# Patient Record
Sex: Male | Born: 1955 | Race: White | Hispanic: No | Marital: Married | Smoking: Former smoker
Health system: Southern US, Community
[De-identification: ages and names within clinical notes are randomized; demographics above are authoritative.]

## PROBLEM LIST (undated history)

## (undated) DIAGNOSIS — M549 Dorsalgia, unspecified: Secondary | ICD-10-CM

## (undated) DIAGNOSIS — M1990 Unspecified osteoarthritis, unspecified site: Secondary | ICD-10-CM

## (undated) DIAGNOSIS — I639 Cerebral infarction, unspecified: Secondary | ICD-10-CM

## (undated) DIAGNOSIS — M4802 Spinal stenosis, cervical region: Secondary | ICD-10-CM

## (undated) HISTORY — PX: OTHER SURGICAL HISTORY: SHX169

---

## 2007-04-08 DIAGNOSIS — I639 Cerebral infarction, unspecified: Secondary | ICD-10-CM

## 2007-04-08 HISTORY — DX: Cerebral infarction, unspecified: I63.9

## 2007-08-18 ENCOUNTER — Other Ambulatory Visit: Payer: Self-pay

## 2007-08-18 ENCOUNTER — Emergency Department: Payer: Self-pay | Admitting: Internal Medicine

## 2007-09-14 ENCOUNTER — Ambulatory Visit: Payer: Self-pay | Admitting: Cardiology

## 2007-09-14 ENCOUNTER — Observation Stay (HOSPITAL_COMMUNITY): Admission: EM | Admit: 2007-09-14 | Discharge: 2007-09-15 | Payer: Self-pay | Admitting: Emergency Medicine

## 2007-09-15 ENCOUNTER — Encounter (INDEPENDENT_AMBULATORY_CARE_PROVIDER_SITE_OTHER): Payer: Self-pay | Admitting: Neurology

## 2007-09-22 ENCOUNTER — Inpatient Hospital Stay (HOSPITAL_COMMUNITY): Admission: EM | Admit: 2007-09-22 | Discharge: 2007-09-24 | Payer: Self-pay | Admitting: Emergency Medicine

## 2007-09-22 ENCOUNTER — Ambulatory Visit: Payer: Self-pay | Admitting: Cardiology

## 2007-09-29 ENCOUNTER — Ambulatory Visit: Payer: Self-pay

## 2007-10-20 ENCOUNTER — Ambulatory Visit: Payer: Self-pay | Admitting: Cardiology

## 2007-11-05 ENCOUNTER — Inpatient Hospital Stay (HOSPITAL_COMMUNITY): Admission: RE | Admit: 2007-11-05 | Discharge: 2007-11-07 | Payer: Self-pay | Admitting: Neurosurgery

## 2010-08-20 NOTE — H&P (Signed)
NAME:  Joseph Butler, Joseph Butler NO.:  [id number]   MEDICAL RECORD NO.:  [id number]          PATIENT TYPE:  INP   LOCATION:  3035                         FACILITY:  [facility]   PHYSICIAN:  Levert Feinstein, MD          DATE OF BIRTH:  [birth year]   DATE OF ADMISSION:  09/14/2007  DATE OF DISCHARGE:                              HISTORY & PHYSICAL   CHIEF COMPLAINT:  Acute onset of left side numbness.   HISTORY OF PRESENT ILLNESS:  Joseph Butler is a 55 year old right-handed  male, presenting with acute onset of left side numbness, starting at the  left face, spreading to involving the left chest, upper extremity, flank  area and also left lower extremity around 7:30 this morning.  During the  intense left hemiparesthesia he also described difficulty with his left  leg, but he was able to ambulate to his wife's office but described he  had to drag his left leg.  Symptom has since improved and he has similar  presentation on Aug 17, 2007, has been evaluated by Dr. Sandria Manly recently as  an outpatient, MRI of the brain was pending.  He was started on aspirin  and Plavix, since the visit with Dr. Sandria Manly about a week ago he is on  Plavix only.   REVIEW OF SYSTEMS:  He described left chest tightness sensation as if  muscle is pulled, deny GI, GU, and endocrine or pulmonary complaints.   PAST MEDICAL HISTORY:  Heavy smoker for over 30 years.   PAST SURGICAL HISTORY:  None.   FAMILY HISTORY:  Mother suffered coronary artery disease.  Father  suffered epilepsy.   SOCIAL HISTORY:  Heavy smoker.  Denied drink or illicit drugs.   PHYSICAL EXAMINATION:  VITAL SIGNS:  Temperature 98, blood pressure 140-  158/75-91, heart rate of 89, and respirations 12.  CARDIAC:  Regular rate and rhythm.  PULMONARY:  Clear to auscultation bilaterally.  NECK:  Supple.  No carotid bruits.  NEUROLOGIC:  He is alert and oriented to history taking, conversation.  There was no dysarthria, no aphagia.  Cranial nerve 2  through 12 are  normal.  MOTOR EXAMINATION:  He had demonstrated variable effort on left lower  extremity motor examination, but the motor strength was felt to be  normal in 4 limbs.  SENSORY EXAMINATION:  He described decreased light touch and pinprick on  the left side and there was no extinction but he split the midline with  tuning fork exam.  Deep tendon reflexes were brisk and symmetric.  Plantar responses were flexor.  He has no dysmetria.  Gait was deferred.  NIH stroke scale 1.   LAB EVALUATION:  CBC was normal.  CT of the brain without contrast was  reviewed that was with no acute findings.   ASSESSMENT AND PLAN:  A 55 year old heavy smoker presenting with left  acute hemiparesis, variable effort on examination.  Differentiation  diagnosis including transient ischemic attack, involving the right  thalamus versus angina.  Plan is as following,  1. Telemetry.  Admit to stroke service.  2.  MRI of the brain.  3. Complete stroke workup including echocardiogram lab evaluation.  4. Plavix 75 mg every day  5. I also discussed with the family with rapidly improving symptoms      and sensory only symptoms, he is not a candidate for IV TPA.  The      patient and family agrees.   ADDENDUM:  MRI of brain: no acute lesion. [place] of carotid artery: patent.  LDL 130, starting zocor 20mg  qday, keep plavix.  ECHO report pending.  NO acute cerebral vascular event or MI found.      Levert Feinstein, MD  Electronically Signed     YY/MEDQ  D:  09/14/2007  T:  09/15/2007  Job:  [id number]

## 2010-08-20 NOTE — Discharge Summary (Signed)
NAME:  Joseph Butler, Joseph Butler NO.:  [id number]   MEDICAL RECORD NO.:  [id number]          PATIENT TYPE:  INP   LOCATION:  3001                         FACILITY:  [facility]   PHYSICIAN:  Pramod P. Pearlean Brownie, MD    DATE OF BIRTH:  February 13, 1956   DATE OF ADMISSION:  09/22/2007  DATE OF DISCHARGE:  09/24/2007                               DISCHARGE SUMMARY   DISCHARGE DIAGNOSES:  1. Left arm and left face numbness, not felt to be a transient ishemic      attack, no stroke, question related to cardiology etiology for cord      compression.  2. Transient ischemic attack on September 14, 2007.  3. Tobacco abuse.  4. Hyperlipidemia.  5. Polycythemia vera.  6. 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  7. Chronic obstructive pulmonary disease.  8. Rotator cuff problems.   DISCHARGE MEDICATIONS:  1. Plavix 75 mg a day.  2. Zocor 20 mg a day.  3. Aspirin 81 mg a day.   STUDIES PERFORMED:  1. MRI of the brain is normal.  2. MRA of the brain shows no significant stenosis or pathologic      findings.  3. MRA of the neck shows congenital variations, but no pathologic      findings.  4. 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  Moderate to severe predominantly left-sided central      canal stenosis at C3-C4.  Severe left-sided foraminal narrowing at      C4-C5 and C6-C7 and greater extent at C3-C4 and C5-C6.  Severe      right-sided foraminal stenosis at C4-C5 and also at C5-C6 and      moderate right-sided foraminal disease at C3-C4 and C6-C7.  Mild-to-      moderate facet hypertrophy at C6-C7 without significant stenosis.  5. EKG shows normal sinus rhythm.   LABORATORY STUDIES:  CBC, normal.  Chemistry with glucose 138, normal.  Coags normal.  Liver function tests normal.  Cardiac enzymes negative.   HISTORY OF PRESENT ILLNESS:  Mr. Heston Widener is a 55 year old right-  handed Caucasian male who just left the hospital 1 week ago with similar  symptoms.  The patient says he awoke the morning of the admission  feeling okay, then had episode where he felt pain in his neck and then  fixation over his head.  His numbness extended into the left side of his  face and arm and to a lesser extent of the leg.  Associated with this,  he has sensation of chest pressure and mild shortness of breath.  He  took an aspirin, felt a little bit better, then tried to go to work.  Once he got active at work, he began to feel poorly again.  He called  his wife in the office and therefore was advised to come to the  emergency department for further evaluation.  In the emergency room, he  is still experiencing a bit of numbness on the left side.  He was  admitted  just 1 week ago with similar symptoms where he had a full  workup including MRA.  Everything was found to be unremarkable other  than some elevated lipids for which he was started on Zocor.  The  patient stated he had a similar episode about 1 month ago for which he  was seen at [facility] and started on aspirin and Plavix.  He was  admitted to the hospital today for further stroke evaluation.  He was  not a TPA candidate secondary to quick resolution of symptoms.   HOSPITAL COURSE:  Again, MRI was negative for acute stroke.  MRA showed  no significant stenosis.  MRI of the C-spine did show some cord  compression from C2 down to C6 a variant degree, which likely could be  the etiology of his symptoms.  The patient also had a cardiology consult  in the hospital and outpatient Myoview is scheduled with Dr. Regino Schultze PA  on October 20, 2007, at 1:15 p.m.   DISCHARGE CONDITION:  The patient is awake, alert, and oriented.  Speech  clear.  No aphasia.  He still has an subjective numbness in the left  side of his face and he has decreased sensation on the left side and  splits the midline on his forehead and sternum.  He has no extremity  weakness.  His tendon reflexes are 2+ bilaterally.   Plantars are down  bilaterally.   DISCHARGE/PLAN:  1. Referred to Dr. Danae Orleans. Venetia Maxon at the patient's request.  I have      called their office and left a message.  I also asked for      neurologic staff to help with this referral, however, this was only      a recorded line.  2. Follow up Dr. Regino Schultze PA on October 20, 2007, at 1:15 p.m.  3. Exercise stress test on September 29, 2007, at 9:45 a.m.  4. Follow up with primary care and Dr. Sandria Manly as needed.      Annie Main, N.P.    ______________________________  Sunny Schlein. Pearlean Brownie, MD    SB/MEDQ  D:  09/24/2007  T:  09/24/2007  Job:  [id number]   cc:   Everardo Beals. Juanda Chance, MD, [facility]  Danae Orleans. Venetia Maxon, M.D.  Genene Churn. Love, M.D.

## 2010-08-20 NOTE — Op Note (Signed)
NAME:  Joseph Butler, Joseph Butler NO.:  [id number]   MEDICAL RECORD NO.:  [id number]          PATIENT TYPE:  INP   LOCATION:  3113                         FACILITY:  [facility]   PHYSICIAN:  Danae Orleans. Venetia Maxon, M.D.  DATE OF BIRTH:  06-15-1955   DATE OF PROCEDURE:  11/05/2007  DATE OF DISCHARGE:                               OPERATIVE REPORT   PREOPERATIVE DIAGNOSES:  Herniated cervical disk with cervical  myelopathy, cervical stenosis, and spondylosis with myelopathy with  cervical radiculopathy C3-C4, C4-C5, and C5-C6 levels.   POSTOPERATIVE DIAGNOSES:  Herniated cervical disk with cervical  myelopathy, cervical stenosis, and spondylosis with myelopathy with  cervical radiculopathy C3-C4, C4-C5, and C5-C6 levels.   PROCEDURE:  Anterior cervical decompression and fusion C3-C4, C4-C5, and  C5-C6 levels with allograft, bone graft, morselized autograft, 4 troughs  anterior cervical plating C3 through C6 levels.   SURGEON:  Danae Orleans. Venetia Maxon, MD   ASSISTANT:  Georgiann Cocker, RN and Hewitt Shorts, MD   ANESTHESIA:  General endotracheal anesthesia.   ESTIMATED BLOOD LOSS:  Minimal.   COMPLICATIONS:  None.   DISPOSITION:  Recovery.   INDICATIONS:  Joseph Butler is a 55 year old man with multilevel herniated  cervical disk with cervical spondylosis and stenosis with cervical  myelopathy.  It was elected to take him to surgery for anterior cervical  decompression and fusion at the C3-C4, C4-C5, and C5-C6 levels.  He has  a spinal canal diameter approximately 6 mm at C5-C6 and 5.9 mm at C4-C5,  worse on the left.   PROCEDURE:  Joseph Butler was brought to the operating room.  Following  satisfactory and uncomplicated induction of general endotracheal  anesthesia and placing intravenous lines, the patient was placed in a  supine position on the operating table.  His neck was placed in neutral  alignment.  He was placed in 5 pounds Holter traction.  The anterior  neck was then prepped  and draped in usual sterile fashion.  Area of  planned incision was infiltrated with 0.25% Marcaine and 0.5% lidocaine  with 1:200,000 epinephrine.  Incision was made from midline to the  anterior border sternocleidomastoid muscle on the left side of midline  in middle neck crease, carried sharply through platysmal layer.  Subplatysmal dissection was performed exposing the anterior border  sternocleidomastoid muscle using blunt dissection and handheld  retractors.  The carotid sheath was kept lateral and trachea and  esophagus kept medial exposing the anterior cervical spine.  Bent spinal  needle was placed and he was felt to be at the C3-C4 level and this was  confirmed on intraoperative x-ray.  Subsequently, the longus colli  muscles were taken down from the anterior cervical spine from C3-C6  using electrocautery and Key elevator and self-retaining shadow line  retractor was placed to facilitate exposure along up and down  retractors.  The interspaces at C4-C5 and C5-C6 were then incised with  15 blade and disk material was removed in piecemeal fashion.  Endplates  were stripped with residual disk material.  Additionally, this was done  at the C3-C4 level.  Using  intraoperative microscope, the endplates were  decorticated with high-speed drill initially at C5-C6 and the uncinate  spurs were drilled down.  The spinal cord dura was decompressed as were  both neural foramina.  Hemostasis was assured with Gelfoam soaked in  thrombin.  After trial sizing a 6-mm allograft bone wedge was selected,  fashioned with high-speed drill pack with morselized autograft and also  4 troughs.  This was inserted in the interspace and countersunk  appropriately.  Similar decompression was performed at C4-C5 level.  Again, spinal cord dura was decompressed as were both neural foramina.  Care was taken not to __________ instrument the neural foramina out of  concern for the fragility of the C5 nerve roots.   It was felt that the  spinal cord dura was adequately decompressed.  Hemostasis was assured  with Gelfoam soaked in thrombin.  Subsequently, a 6-mm bone allograft  which was selected to fashion with high-speed drill pack, morselized  bone autograft, and 4 troughs inserted in the interspace and countersunk  appropriately.  Attention was then turned to the C3-C4 level, similar  decompression was performed and at this level a 7-mm bone graft was  fashioned with high-speed drill and packed with morselized bone  autograft and 4 troughs after the spinal cord dura was decompressed and  as were both neural foramina.  After trial sizing a 51-mm Trestle, an  anterior cervical plate was then affixed to the anterior cervical spine  using variable angle 14 mm screws two at C3, two at C4, two at C5, and  two at C6.  All screws had excellent purchase.  Locking mechanisms were  engaged.  Final x-ray demonstrated well-positioned interbody graft in  the anterior cervical plate.  Traction weight was removed prior to  placing the plate.  Wound was irrigated.  Soft tissues were inspected  and found to be in good repair.  Hemostasis assured.  The platysmal  layer was closed with 3-0 Vicryl sutures.  Skin edges were approximated  with 3-0 Vicryl and subcu stitch.  The wound was dressed with Dermabond.  The patient was extubated in the operating room and taken to recovery in  stable and satisfactory condition, and tolerated the operation well.   COUNTS:  Correct at the end of the case.      Danae Orleans. Venetia Maxon, M.D.  Electronically Signed     JDS/MEDQ  D:  11/05/2007  T:  11/06/2007  Job:  [id number]

## 2010-08-20 NOTE — Consult Note (Signed)
NAME:  Joseph Butler, Joseph Butler NO.:  [id number]   MEDICAL RECORD NO.:  [id number]          PATIENT TYPE:  INP   LOCATION:  3001                         FACILITY:  [facility]   PHYSICIAN:  Everardo Beals. Juanda Chance, MD, FACCDATE OF BIRTH:  1955/06/22   DATE OF CONSULTATION:  09/22/2007  DATE OF DISCHARGE:                                 CONSULTATION   PRIMARY CARDIOLOGIST:  [facility] being seen by Dr. Charlies Constable.   REQUESTING PHYSICIAN:  Marolyn Hammock. Thad Ranger, M.D., Neurology.   PATIENT PROFILE:  A 55 year old married Caucasian male without prior  cardiac history who presents falling a third episode of left-sided  numbness with mild chest pressure.   PROBLEM LIST:  1. History of TIA with left hemiparesis dating back to Aug 17, 2007.      a.     Negative head CT and MRI of the brain on September 14, 2007.      b.     September 15, 2007, carotid ultrasound revealing no significant       bilateral internal carotid artery stenosis.      c.     September 15, 2007, 2D-echocardiogram EF 60%, no wall motion       abnormalities.  Normal aortic and mitral valves.  No source of       cardiac emboli.  2. Tobacco abuse.      a.     About 50-pack year history smoking half a pack a day for       between 30 and 35 years.  He quit 1 week ago.  3. Hyperlipidemia with LDL of 130 on his last admission.  4. Degenerative joint disease of the neck.  5. COPD.  6. Reported history of polycythemia vera.   HISTORY OF PRESENT ILLNESS:  A 55 year old married Caucasian male  without prior cardiac history.  He was in his usual state of health from  Aug 17, 2007, when he had sudden onset of left-sided facial, torso, arm,  or end leg numbness with associated leg weakness and mild left axillary  to mid chest numbness and dull pressure.  He was seen at [facility] and apparently diagnosed with DJD and possible TIA.  He was  placed on aspirin and Plavix and subsequently follow up with Dr. Sandria Manly in  the office.  He  had recurrent symptoms from September 14, 2007, and this time  was evaluated at [facility].  He underwent a head CT, MRI of the brain,  carotid ultrasound, and 2D-echocardiogram, all which were normal.  He  was discharged home with a diagnosis of TIA on September 15, 2007.  He had  returned to work and was doing well without recurrence of symptoms with  the exception of left-sided mild facial numbness which  has been  persistent since Aug 17, 2007.  This morning, he woke at about 5:30 and  noted left-sided numbness from head to toe with mild left-sided  weakness.  He also noted that he just did not feel right.  He had mild  left chest numbness and pressure with sensations  though he could not  take a deep enough breath to set himself.  Regardless, he got ready for  work without worsening of symptoms and then while driving in his car to  work had sudden onset of lightheadedness wave of worsened symptoms and  numbness.  These symptoms lasted about 20 minutes and resolved by the  time he presented to work or at least went back to the baseline level  with numbness that he was experiencing since 5:30 this morning.  He then  loaded up his truck with a equipment for the day's work, and when he was  done doing that had another had another wave of worsened numbness and  chest pressure with mild shortness of breath.  At this point, he  presented to the [facility] ED.  His symptoms persisted for about 4  hours in the ED prior to resolving completely, spontaneously.  He has  been evaluated by neurology and is pending MRI, MRA of the head and  neck.  Currently, he has mild left-sided facial weak or numbness but  otherwise is asymptomatic and denies any chest pressure.   ALLERGIES:  No known drug allergies.   HOME MEDICATIONS:  1. Plavix 75 mg daily.  2. Zocor 20 mg daily.  3. Aspirin 81 mg daily.   FAMILY HISTORY:  Mother is alive at age 5 with atrial fibrillation and  hypertension.  Father is alive at age  110 with history of seizure  disorder and hypertension.  He has 3 younger brothers, all are alive and  well.   SOCIAL HISTORY:  Lives [place] with his wife.  He works in Economist.  He previously smoked a pack and half a day for  approximately 35 years but quit just last week.  He denies any alcohol,  although previously used alcohol heavily.  He denies any drug use.  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   REVIEW OF SYSTEMS:  Positive for chills and he has been started on  Plavix.  He has had a chest numbness with dull pressure like sensation  as outlined in the HPI.  He also had associated dyspnea as outlined in  the HPI.  Left leg weakness and left-sided numbness as outlined in the  HPI.  Otherwise all systems are reviewed and negative.   PHYSICAL EXAMINATION:  VITAL SIGNS:  Temperature 98.1, heart rate 70,  respirations 20, blood pressure 144/92, and pulse ox 98% on room air.  GENERAL:  Pleasant white male in no acute distress. Awake, alert, and  oriented x3.  HEENT:  Normal.  Nares grossly intact.  Nonfocal.  SKIN:  Warm and dry without lesions or masses.  NECK:  No bruits or JVD.  LUNGS:  Respirations regular unlabored.  CARDIAC:  Irregular S1and S2.  No S3.  No murmurs.  ABDOMEN:  Soft, nontender, and nondistended.  Bowel sounds x4.  EXTREMITIES:  Warm, dry, and pink.  No clubbing, cyanosis or edema.  Dorsal pedis and posterior tibialis pulses are 2+ and equal bilaterally.   ACCESSORY CLINICAL FINDINGS:  EKG is currently pending.  MRI/MRA of the  head and neck vessels is currently pending.  All his lab work is  currently pending.   ASSESSMENT AND PLAN:  1. Chest pressure, this in the setting of left-sided chest numbness.      It is typical and atypical features.  Risk factors include tobacco  abuse and hyperlipemia with an LDL  of 130 in his last admission.      He is currently on statin.   There has also question of hypertension      as his pressures are currently elevated, although I note on his      last admission pressures were in the 110s to 130s.  We will check      an ECG now, second cardiac markers.  Cardiac enzymes are negative.      Plan on an outpatient in Myoview.  If however cardiac enzymes are      positive, we will perform catheterization.  Notably, cardiac      enzymes are negative during presentation, September 14, 2007, although      his symptoms lasted longer this time.  Continue aspirin, Plavix,      and statin.  2. Hypertension, blood pressure currently elevated but as above he ran      in the 110s to 130s on his last admission.  Follow and will add      chlorthalidone if needed.  3. Hyperlipidemia.  Continue statin.  4. Question TIA per Neurology.  We will hold off on a TEE pending      results of MRI and MRA.  5. Tobacco abuse.  Continue smoking cessation strongly.      Nicolasa Ducking, ANP      Bruce R. Juanda Chance, MD, [facility]  Electronically Signed    CB/MEDQ  D:  09/22/2007  T:  09/23/2007  Job:  [id number]

## 2010-08-20 NOTE — Assessment & Plan Note (Signed)
[facility]                            CARDIOLOGY OFFICE NOTE   Joseph Butler, Joseph Butler                        MRN:          [id number]  DATE:10/20/2007                            DOB:          1955-09-16    PRIMARY CARDIOLOGIST:  Everardo Beals. Juanda Chance, MD, [facility].   PRIMARY CARE PHYSICIAN:  Julieanne Manson in [place].   This is a 55 year old married white male patient who had a TIA on September 14, 2007.  He then was readmitted on September 22, 2007 with left arm, left  face, and left body numbness.  It was felt to be related to a cord  compression from severe central canal stenosis of the C4- C5 and C5-C6.  He is scheduled for surgery on November 05, 2007.  He continues to have  spells, where he becomes numb on his left side and he has some chest  pain with it.  Because of this, we did a stress adenosine Myoview, which  showed no ischemia or infarct.  There was a question of apical  hypokinesis, EF of 48%, but 2-D echo showed normal LV function, ejection  fraction is 60%.  There was no left ventricular regional wall motion  abnormalities and there was no cardiac source of embolus.   The patient denies any cardiac complaints other than some pressure when  he has a numbness along his whole left side.   CURRENT MEDICATIONS:  1. Plavix 75 mg daily.  2. Zocor 20 mg daily.  3. Aspirin 81 mg daily.   PHYSICAL EXAMINATION:  GENERAL:  This is a pleasant 55 year old white  male in no acute distress.  VITAL SIGNS:  Blood pressure 127/86, pulse 83, and weight 178.  NECK:  Without JVD, HJR, bruit or thyroid enlargement.  LUNGS:  Clear; anterior, posterior, and lateral.  HEART:  Regular rate and rhythm at 80 beats per minute.  Normal S1 and  S2.  No murmur, rub, bruit, thrill or heave noted.  ABDOMEN:  Soft  without organomegaly, masses, lesions or abnormal tenderness.  EXTREMITIES:  Without cyanosis, clubbing, or edema.  There is good  distal pulses.   EKG; normal sinus  rhythm with LVH, nonspecific ST changes, and no acute  change.   IMPRESSION:  1. Transient ischemic attack on September 14, 2007, currently on Plavix.  2. Recurrent left arm, face, and left-sided numbness, felt secondary      to severe central canal stenosis of C4 through C5 and C5 through C6      for surgery on November 05, 2007, by Dr. Venetia Maxon.  3. Stress adenosine Myoview negative for ischemia on September 28, 2007,      question of apical hypokinesis, ejection fraction 48%, but 2-D echo      showed normal left ventricular function, EF 60%.  No wall motion      abnormality.  4. Tobacco abuse, quit.  5. Hyperlipidemia.  6. Polycythemia vera.  7. Degenerative joint disease.  8. Chronic obstructive pulmonary disease.   PLAN:  At this time, the patient is stable from the cardiac standpoint.  We told  him we are happy to follow him up in the hospital during his  surgery if any need arises.  He will see Dr. Juanda Chance back in followup in  4 months.       Jacolyn Reedy, PA-C  Electronically Signed      Jesse Sans. Daleen Squibb, MD, [facility]  Electronically Signed   ML/MedQ  DD: 10/20/2007  DT: 10/21/2007  Job #: [id number]

## 2010-08-20 NOTE — H&P (Signed)
NAME:  BREVON, DEWALD NO.:  [id number]   MEDICAL RECORD NO.:  [id number]          PATIENT TYPE:  INP   LOCATION:  3001                         FACILITY:  [facility]   PHYSICIAN:  Casimiro Needle L. Reynolds, M.D.DATE OF BIRTH:  1955-11-17   DATE OF ADMISSION:  09/22/2007  DATE OF DISCHARGE:                              HISTORY & PHYSICAL   CHIEF COMPLAINT:  Possible TIA.   HISTORY OF PRESENT ILLNESS:  This is the second [facility] admission for this 55 year old man who is just in the hospital  last week with the very similar event.  The patient says that he awoke  this morning feeling okay, then he had an episode when felt pain in his  neck and numb sensation, over his head.  He experienced numbness  involving the left side of the face and arm, to a lesser extent the leg.  Associated with this, he had sensation of chest pressure and mild  shortness of breath.  He took an aspirin, felt a little bit better, then  try to go to work, once he got active at work, he began to feel poorly  again.  He called his wife, and called the office, and therefore, was  advised to come to the emergency department at [facility] for further  evaluation and likely admission.  He is still now experiencing a little  bit of the numbness on left side.  He states that he had a very similar  episode 1 week ago, at which time, he was admitted and had a prestroke  workup.  MRI at that time was normal, MRA was not done, 2D  echocardiogram was quite unremarkable, carotid Dopplers were  unremarkable.  He was found to be hyperlipidemic, he was discharged on  Zocor, in addition he was on aspirin, and Plavix.  He also states he had  an episode about 1 month ago for which he has seen an [facility]  and started on aspirin and Plavix.   PAST MEDICAL HISTORY:  Remarkable for recent diagnosis of  hyperlipidemia.  Beyond that, he really has significant problems with  medical problems.  He  does report some problems with last couple of  months with his shoulder.  No known history of hypertension, diabetes,  and coronary disease.   MEDICATIONS:  1. Aspirin 81 mg daily.  2. Plavix 75 mg daily.  3. Zocor 20 mg daily.   ALLERGIES:  No known drug allergies.   FAMILY HISTORY:  Mother with coronary artery disease and father with  seizures.   SOCIAL HISTORY:  He has been heavy smoker.  He has a maintenance job.  He denied any history of tobacco or illicit drug use.   REVIEW OF SYSTEMS:  NEUROLOGIC:  Mild neck pain and headache as above.  CD:  Chest pain with dyspnea as above.  PULMONARY:  Mild shortness of  breath as above.  MUSCULOSKELETAL:  Shoulder pain is lost.  PSYCHIATRIC:  Seems little bit anxious.  GI, GU, ENDOCRINE, HEME, and NEUROLOGIC:  Negative.   PHYSICAL EXAMINATION:  VITAL SIGNS:  Temperature 97.3, blood pressure  155/89, pulse 82, respirations 18, and O2 sat 100% on room air.  GENERAL:  This is an anxious but otherwise healthy-appearing man supine  on hospital bed in no distress.  HEAD:  Cranium is normocephalic and atraumatic.  Oropharynx benign.  NECK:  Supple without carotid or supraclavicular bruits.  HEART:  Regular rate and rhythm without murmurs.  CHEST:  Clear to auscultation bilaterally.  NEUROLOGIC:  Mental status, he is awake and alert.  He is oriented to  time, place, and person.  Recent and remote memory are intact.  Attention span, concentration, and fund of knowledge are all  appropriate.  He has no defects to confrontational naming and can repeat  a phrase.  Cranial nerves; funduscopic exam is benign.  Pupils are equal  and briskly reactive.  Extraocular movements are full without nystagmus.  Visual field are full to confrontation.  Hearing is intact to  conversational speech.  Facial sensation is diminished on the left side  to pinprick.  Face, tongue and palate moved normally and symmetrically.  Motor; normal bulk and tone.  He has  obvious give-away weakness in left  lower extremity.  In the left upper extremity, he has some weakness in  the hand, most likely is give-away, although a physiologic finding can  be ruled out.  Sensory reports diminished pinprick sensation at left  upper extremity, intact in left lower.  Double stimulation is intact.  Coordination, finger-to-nose and heel-to-chin is performed very  accurately.  Gait, he complains of dizziness on standing.  He was able  to ambulate independently without much difficulty.  Reflexes are little  brisk in the lower extremities.  Toes are downgoing bilaterally.   LABORATORY REVIEW:  No labs are performed at this time.  A week ago, he  had lipid count demonstrating an LDL of 130, normal homocysteine level,  unremarkable CBC, and CMET.   IMPRESSION:  Episode of left-sided numbness.  He has no physiological  findings on examination.  It is not clear whether it is instituted TIA  or possibly anxiety.  Of note, he also had chest pain and associated  chest pressure with this, and possibly coronary artery disease is  raised.   PLAN:  Admit to the Stroke Service.  We will get an MRA of the  intracranial and extracranial circulation, which he did not have at his  last visit.  Also consider a TEE.  I think it might be useful for him to  see a cardiologist while he is here given his episode of chest pain.  Stroke Service to follow.      Michael L. Thad Ranger, M.D.  Electronically Signed     MLR/MEDQ  D:  09/22/2007  T:  09/23/2007  Job:  [id number]

## 2010-08-20 NOTE — Discharge Summary (Signed)
NAME:  Joseph Butler, Joseph Butler NO.:  [id number]   MEDICAL RECORD NO.:  [id number]          PATIENT TYPE:  INP   LOCATION:  3035                         FACILITY:  [facility]   PHYSICIAN:  Levert Feinstein, MD          DATE OF BIRTH:  05/03/1955   DATE OF ADMISSION:  09/14/2007  DATE OF DISCHARGE:  09/15/2007                               DISCHARGE SUMMARY   DIAGNOSES AT THE TIME OF DISCHARGE:  1. Acute left hemiparesis in the setting of negative MRI, diagnosis      transient ischemic attack.  2. Cigarette smoker.  3. Dyslipidemia.   MEDICINES AT THE TIME OF DISCHARGE:  1. Plavix 75 mg a day.  2. Zocor 20 mg a day.   STUDIES PERFORMED:  1. CT of the brain on admission shows no acute abnormality.  MRI of      the brain shows no acute abnormality.  Minimal right mastoid fluid.  2. MRA, not performed.  3. EKG shows normal sinus rhythm with moderate voltage criteria for      LVH, maybe normal variant.  4. Carotid Doppler shows no ICA stenosis.  Transcranial Doppler      performed, results pending.  2-D echocardiogram performed, results      pending.   LABORATORY STUDIES:  CBC normal.  Chemistry normal.  Coagulation  studies, normal.  Liver function tests, normal.  Cardiac enzymes  negative.  Cholesterol 179, triglycerides 107, HDL 28, and LDL 130.  Urinalysis negative.  Homocysteine pending.  Urine drug screen,  negative.  Alcohol level less than 5.  Hemoglobin A1c 5.7.   HISTORY OF PRESENT ILLNESS:  Mr. Joseph Butler is a 55 year old right-  handed Caucasian male who presents with acute onset of left-sided  numbness starting in the left face spreading to involving the left  chest, upper extremity, flank area, and also lower extremity around 4:30  in the morning of admission.  When he awoke later, he did not feel good,  and during the intense left hemiparesis, he also described difficulty  with his left leg, but he was able to ambulate to his wife Joseph Butler but  described a dragging  left leg.  Symptoms have since improved.  He had a  similar presentation on Aug 17, 2007, and evaluated by Dr. Sandria Manly recently  as an outpatient.  He was started at that time on aspirin and Plavix.  With this visit with Dr. Sandria Manly about a week ago, he is on Plavix only.  He had rapid improvement and was not a candidate for IV t-PA.  He was  admitted to the hospital for further stroke evaluation.   HOSPITAL COURSE:  MRI was negative for acute stroke.  His left  hemiparesis has totally resolved and has no therapy needs.  He was  continued on Plavix for stroke prevention.  The patient also has risk  factors of cigarette smoker for which he is advised to stop and he  agreed, and also mild dyslipidemia with LDL of 130 and was started on  Zocor 20.  The patient requested return  to work.  As he has no  neurologic deficit, Dr. Terrace Arabia agreed for return home, September 20, 2007.   CONDITION ON DISCHARGE:  The patient is alert and oriented x3.  Speech  is clear.  No aphasia.  No extremity weakness.  Gait steady.  He has  subjective left facial numbness, which is much improved.   DISCHARGE/PLAN:  1. Discharge home with family.  2. Plavix for stroke prevention.  3. New Zocor for dyslipidemia.  4. Followup our primary care.  5. Stop smoking.  6. Followup our primary care physician at [facility] in 1-3 months.  7. Followup with Dr. Sandria Manly at appointment in July, already scheduled.      Annie Main, N.P.      Levert Feinstein, MD  Electronically Signed    SB/MEDQ  D:  09/15/2007  T:  09/16/2007  Job:  [id number]   cc:   Genene Churn. Love, M.D.

## 2010-08-23 NOTE — Discharge Summary (Signed)
NAME:  [name], [name] NO.:  [id number]   MEDICAL RECORD NO.:  [id number]          PATIENT TYPE:  INP   LOCATION:  3009                         FACILITY:  [facility]   PHYSICIAN:  Danae Orleans. Venetia Maxon, M.D.  DATE OF BIRTH:  Mar 28, 1956   DATE OF ADMISSION:  11/05/2007  DATE OF DISCHARGE:  11/07/2007                               DISCHARGE SUMMARY   REASON FOR ADMISSION:  1. Cervical spondylosis with myelopathy.  2. Cervical disk herniation with myelopathy.  3. History of tobacco use.  4. History of TIA in the past.   FINAL DIAGNOSES:  1. Cervical spondylosis with myelopathy.  2. Cervical disk herniation with myelopathy.  3. History of tobacco use.  4. History of TIA in the past.   HISTORY OF ILLNESS AND HOSPITAL COURSE:  Joseph Butler is a 55 year old  man with cervical spondylosis and cervical myelopathy at C4-5, and C5-6  levels.  He was admitted to the hospital on the same day of procedure.  The patient then underwent uncomplicated anterior cervical decompression  and fusion at C4-5 and C5-6 levels.  Postoperatively, he had improvement  in the left upper extremity strength.  He initially had some left  deltoid weakness, but this subsequently resolved.  He was doing well and  mobilized on November 07, 2007 and discharged to home at that point with  instructions to follow up in the office with me in 3 weeks.   DISCHARGE MEDICATIONS:  Include Percocet and Flexeril.      Danae Orleans. Venetia Maxon, M.D.  Electronically Signed     JDS/MEDQ  D:  12/08/2007  T:  12/09/2007  Job:  [id number]

## 2011-01-02 LAB — COMPREHENSIVE METABOLIC PANEL
ALT: 22
ALT: 24
AST: 15
AST: 18
AST: 13
Albumin: 3.9
Albumin: 4.2
Albumin: 3.9
Alkaline Phosphatase: 62
BUN: 7
CO2: 26
Calcium: 9.6
Calcium: 9.3
Chloride: 106
Chloride: 107
Creatinine, Ser: 0.82
Creatinine, Ser: 1.18
GFR calc Af Amer: >60
GFR calc Af Amer: >60
GFR calc Af Amer: >60
GFR calc non Af Amer: >60
Potassium: 4
Sodium: 140
Sodium: 141
Total Bilirubin: 0.7
Total Bilirubin: 0.8
Total Bilirubin: 0.8
Total Protein: 6.1

## 2011-01-02 LAB — URINE DRUGS OF ABUSE SCREEN W ALC, ROUTINE (REF LAB)
Amphetamine Screen, Ur: NEGATIVE
Ethyl Alcohol: <10
Marijuana Metabolite: NEGATIVE
Opiate Screen, Urine: NEGATIVE
Propoxyphene: NEGATIVE

## 2011-01-02 LAB — LIPID PANEL
LDL Cholesterol: 130 — ABNORMAL HIGH
Triglycerides: 107

## 2011-01-02 LAB — APTT
aPTT: 27
aPTT: 27

## 2011-01-02 LAB — URINALYSIS, ROUTINE W REFLEX MICROSCOPIC
Bilirubin Urine: NEGATIVE
Glucose, UA: NEGATIVE
Glucose, UA: NEGATIVE
Ketones, ur: NEGATIVE
Nitrite: NEGATIVE
Nitrite: NEGATIVE
Protein, ur: NEGATIVE
Specific Gravity, Urine: 1.013
Urobilinogen, UA: 1
pH: 7.5

## 2011-01-02 LAB — CARDIAC PANEL(CRET KIN+CKTOT+MB+TROPI)
CK, MB: 1.8
Relative Index: 1.4
Total CK: 128
Troponin I: <0.01

## 2011-01-02 LAB — CBC
HCT: 46
HCT: 42.8
MCHC: 35.2
MCV: 90.1
MCV: 89.8
Platelets: 223
Platelets: 215
RBC: 4.76
RBC: 5.11
RDW: 11.8
WBC: 6.7
WBC: 8.3
WBC: 8.2

## 2011-01-02 LAB — TROPONIN I
Troponin I: <0.01
Troponin I: 0.01

## 2011-01-02 LAB — CK TOTAL AND CKMB (NOT AT ARMC)
Relative Index: 1.7
Relative Index: 1.2
Relative Index: 1.4
Total CK: 126

## 2011-01-02 LAB — RAPID URINE DRUG SCREEN, HOSP PERFORMED: Barbiturates: NOT DETECTED

## 2011-01-02 LAB — DIFFERENTIAL
Eosinophils Absolute: 0.1
Eosinophils Relative: 1
Lymphs Abs: 1.2
Monocytes Absolute: 0.6

## 2011-01-02 LAB — ETHANOL: Alcohol, Ethyl (B): <5

## 2011-01-02 LAB — HEMOGLOBIN A1C: Mean Plasma Glucose: 126

## 2011-01-02 LAB — PROTIME-INR
INR: 1
Prothrombin Time: 12.8

## 2011-01-03 LAB — CBC
HCT: 44.5
Hemoglobin: 15.3
MCV: 90.4
RDW: 12
WBC: 8.7

## 2011-01-03 LAB — BASIC METABOLIC PANEL
BUN: 6
Chloride: 104
GFR calc non Af Amer: >60
Glucose, Bld: 87
Potassium: 4.5
Sodium: 142

## 2011-04-22 ENCOUNTER — Emergency Department (HOSPITAL_COMMUNITY): Payer: BC Managed Care – PPO

## 2011-04-22 ENCOUNTER — Encounter (HOSPITAL_COMMUNITY): Payer: Self-pay | Admitting: *Deleted

## 2011-04-22 ENCOUNTER — Emergency Department (HOSPITAL_COMMUNITY)
Admission: EM | Admit: 2011-04-22 | Discharge: 2011-04-22 | Disposition: A | Discharge status: Home / Self Care | Payer: BC Managed Care – PPO | Attending: Emergency Medicine | Admitting: Emergency Medicine

## 2011-04-22 DIAGNOSIS — M542 Cervicalgia: Secondary | ICD-10-CM | POA: Insufficient documentation

## 2011-04-22 DIAGNOSIS — Z79899 Other long term (current) drug therapy: Secondary | ICD-10-CM | POA: Insufficient documentation

## 2011-04-22 DIAGNOSIS — R51 Headache: Secondary | ICD-10-CM | POA: Insufficient documentation

## 2011-04-22 DIAGNOSIS — G253 Myoclonus: Secondary | ICD-10-CM | POA: Insufficient documentation

## 2011-04-22 DIAGNOSIS — R29898 Other symptoms and signs involving the musculoskeletal system: Secondary | ICD-10-CM | POA: Insufficient documentation

## 2011-04-22 DIAGNOSIS — R209 Unspecified disturbances of skin sensation: Secondary | ICD-10-CM | POA: Insufficient documentation

## 2011-04-22 DIAGNOSIS — Z9889 Other specified postprocedural states: Secondary | ICD-10-CM | POA: Insufficient documentation

## 2011-04-22 DIAGNOSIS — M4802 Spinal stenosis, cervical region: Secondary | ICD-10-CM | POA: Insufficient documentation

## 2011-04-22 HISTORY — DX: Spinal stenosis, cervical region: M48.02

## 2011-04-22 HISTORY — DX: Dorsalgia, unspecified: M54.9

## 2011-04-22 LAB — BASIC METABOLIC PANEL
Calcium: 9.4 mg/dL (ref 8.4–10.5)
Creatinine, Ser: 0.95 mg/dL (ref 0.50–1.35)
GFR calc Af Amer: >90 mL/min (ref >90)
GFR calc non Af Amer: >90 mL/min (ref >90)
Sodium: 139 mEq/L (ref 135–145)

## 2011-04-22 LAB — DIFFERENTIAL
Basophils Absolute: 0 10*3/uL (ref 0.0–0.1)
Basophils Relative: 0 % (ref 0–1)
Eosinophils Relative: 0 % (ref 0–5)
Monocytes Absolute: 0.6 10*3/uL (ref 0.1–1.0)
Neutro Abs: 5.9 10*3/uL (ref 1.7–7.7)

## 2011-04-22 LAB — CBC
HCT: 43.6 % (ref 39.0–52.0)
MCHC: 36.5 g/dL — ABNORMAL HIGH (ref 30.0–36.0)
MCV: 86.5 fL (ref 78.0–100.0)
Platelets: 222 10*3/uL (ref 150–400)
RDW: 12.2 % (ref 11.5–15.5)
WBC: 8 10*3/uL (ref 4.0–10.5)

## 2011-04-22 MED ORDER — DIAZEPAM 5 MG PO TABS: 5.0000 mg | ORAL_TABLET | Freq: Two times a day (BID) | ORAL | Status: DC

## 2011-04-22 MED ORDER — DIAZEPAM 5 MG PO TABS
5.0000 mg | ORAL_TABLET | Freq: Once | ORAL | Status: AC
  Administered 2011-04-22: 5 mg via ORAL
  Filled 2011-04-22: qty 1

## 2011-04-22 MED ORDER — HYDROMORPHONE HCL PF 1 MG/ML IJ SOLN
1.0000 mg | Freq: Once | INTRAMUSCULAR | Status: AC
  Administered 2011-04-22: 1 mg via INTRAVENOUS
  Filled 2011-04-22: qty 1

## 2011-04-22 MED ORDER — OXYCODONE-ACETAMINOPHEN 10-325 MG PO TABS: 1.0000 | ORAL_TABLET | ORAL | Status: DC | PRN

## 2011-04-22 MED ORDER — HYDROMORPHONE HCL PF 1 MG/ML IJ SOLN
1.0000 mg | Freq: Once | INTRAMUSCULAR | Status: AC
Start: 2011-04-22 — End: 2011-04-22
  Administered 2011-04-22: 1 mg via INTRAVENOUS
  Filled 2011-04-22: qty 1

## 2011-04-22 NOTE — ED Notes (Signed)
No change in pt condition or pain .

## 2011-04-22 NOTE — ED Notes (Signed)
Patient states that he started to shake tonight while laying in bed around 2300 last night; began in his legs and travelled upwards throughout his entire body.  Patient has a history of cervical stenosis; bone graft in 2009 (C3/C4/C5/C6) -- patient feels that this is related.  Patient reporting left sided numbness, tightness in his neck, and a severe headache (in occipital region).  Patient states that he has had symptoms like this previously; was diagnosed with stenosis and spondylosis the last time he had these symptoms (2009).  Patient does have history of TIA (in May 2009) -- patient states that it is normal to have numbness in the left side of his body since the TIA and diagnoses; numbness comes and goes.  Upon assessment, smile symmetrical.  Hand grips and foot pushes are bilaterally equal and strong.  No drift noted in arms or legs.  Sensory deficit noted on patient's left side (patient can still feel, but states that touching it feels different than right side).  Patient alert and oriented x4; PERRL present.  Wife in waiting room at this time; asked to come back (per patient).  Will continue to monitor.

## 2011-04-22 NOTE — ED Provider Notes (Signed)
Patient reports received from Dr. Nelida Gores, who has seen and evaluate patient. 56 year old male with history of cervical stenosis and prior neck surgery in 2009 is presenting with shooting, jolting pain radiates from neck. He is to be moved to the CDU while awaiting neck and thoracic MRI for further evaluation.  Low suspicion for intracranial pathology.  10:14 AM Normal BMP and CBC. Pt has prior anterior cervical spinal fusion.   MRI cervical spine shows progressive adjacent segment disease resulting in L >R foraminal stenosis and mild to moderate central stenosis.  Possible nerve root encroachment at C7.  Thoracic MRI shows small R paracental disc protrusion at T3-T4 with no significant foraminal compromise or nerve root encroachment.  Head CT is unremarkable.    10:59 AM I have discussed result with Dr. Venetia Maxon, who will see pt in ED after his surgery.    12:39 PM Dr. Venetia Maxon has evaluated the MRI result. He believes patient will benefit from having another surgery. His office will schedule a followup appointment.  Fayrene Helper, PA-C 04/22/11 1439

## 2011-04-22 NOTE — ED Notes (Signed)
PT AMBULATORY TO RESTROOM - GAIT STEADY

## 2011-04-22 NOTE — ED Notes (Signed)
Family at bedside. 

## 2011-04-22 NOTE — ED Notes (Signed)
DR STERN HAS CALLED PA ABOUT Joseph Butler. THEY ARE DISCUSSING PT DISPOSITION

## 2011-04-22 NOTE — ED Notes (Signed)
Report received from Heather RN.

## 2011-04-22 NOTE — ED Notes (Signed)
He has tremors all over his entire body since his nap this afternoon.  He has lr sided numbness and  He has a headache.  He has spinal stenosis.

## 2011-04-22 NOTE — ED Provider Notes (Signed)
History     CSN: [id number]  Arrival date & time 04/22/11  0024   First MD Initiated Contact with Patient 04/22/11 0200      Chief Complaint  Patient presents with  . Tremors    (Consider location/radiation/quality/duration/timing/severity/associated sxs/prior treatment) HPI Comments: Patient has a history of cervical stenosis. States that he was doing some yard work over the last couple of days. He has a history of surgery with plates in 1478. He will take from a nap with intermittent jerking of his upper terminate use bilaterally. He describes an electric shock feeling which results in this Job. He states he had similar symptoms in 2008 when this was first diagnosed. He has no weakness or change in sensation. He states he has slight decrease in sensation to his left side which is stable from his surgery. He also has a headache which began after he noticed the neck pain and jolting. He denies chest pain, shortness of breath, abdominal pain, nausea, vomiting.  His NS is Dr Venetia Maxon  The history is provided by the patient. No language interpreter was used.    Past Medical History  Diagnosis Date  . Back pain   . Stenosis of cervical spine     History reviewed. No pertinent past surgical history.  History reviewed. No pertinent family history.  History  Substance Use Topics  . Smoking status: Former Games developer  . Smokeless tobacco: Not on file  . Alcohol Use: Yes      Review of Systems  Constitutional: Negative for fever, activity change, appetite change and fatigue.  HENT: Positive for neck pain. Negative for congestion, sore throat, rhinorrhea and neck stiffness.   Respiratory: Negative for cough and shortness of breath.   Cardiovascular: Negative for chest pain and palpitations.  Gastrointestinal: Negative for nausea, vomiting and abdominal pain.  Genitourinary: Negative for dysuria, urgency, frequency and flank pain.  Musculoskeletal: Negative for myalgias, back pain and  arthralgias.  Neurological: Positive for tremors and headaches. Negative for dizziness, weakness, light-headedness and numbness.  All other systems reviewed and are negative.    Allergies  Review of patient's allergies indicates no known allergies.  Home Medications   Current Outpatient Rx  Name Route Sig Dispense Refill  . LORATADINE 10 MG PO TABS Oral Take 10 mg by mouth daily.      BP 173/96  Pulse 102  Temp(Src) 98.2 F (36.8 C) (Oral)  Resp 20  SpO2 99%  Physical Exam  Nursing note and vitals reviewed. Constitutional: He is oriented to person, place, and time. He appears well-developed and well-nourished. No distress.  HENT:  Head: Normocephalic and atraumatic.  Mouth/Throat: Oropharynx is clear and moist.  Eyes: Conjunctivae and EOM are normal. Pupils are equal, round, and reactive to light.  Cardiovascular: Normal rate, regular rhythm, normal heart sounds and intact distal pulses.   Pulmonary/Chest: Effort normal and breath sounds normal. No respiratory distress.  Abdominal: Soft. Bowel sounds are normal. There is no tenderness.  Musculoskeletal: He exhibits no tenderness.       Cervical back: He exhibits decreased range of motion, tenderness, bony tenderness and pain. He exhibits no deformity and no laceration.  Neurological: He is alert and oriented to person, place, and time. He displays normal reflexes. No cranial nerve deficit.       Normal strength and sensation. He has intermittent jerking of his upper extremities bilaterally simultaneously.  Skin: Skin is warm and dry.    ED Course  Procedures (including critical care time)  Labs Reviewed  CBC - Abnormal; Notable for the following:    MCHC 36.5 (*)    All other components within normal limits  BASIC METABOLIC PANEL - Abnormal; Notable for the following:    Glucose, Bld 142 (*)    All other components within normal limits  DIFFERENTIAL   Dg Cervical Spine Complete  04/22/2011  *RADIOLOGY REPORT*   Clinical Data: Neck pain, and arm and leg weakness.  CERVICAL SPINE - COMPLETE 4+ VIEW  Comparison: Cervical spine radiographs performed 11/05/2007, and MRI of the cervical spine performed 09/23/2007  Findings: There is no evidence of fracture or subluxation.  The patient is status post anterior cervical spinal fusion at C3-C6. Vertebral bodies demonstrate normal height and alignment. Remaining intervertebral disc spaces are preserved.  Prevertebral soft tissues are within normal limits.  The provided odontoid view demonstrates no significant abnormality.  The visualized lung apices are clear.  IMPRESSION: No evidence of fracture or subluxation along the cervical spine; status post anterior cervical spinal fusion at C3-C6.  Original Report Authenticated By: Tonia Ghent, M.D.   Ct Head Wo Contrast  04/22/2011  *RADIOLOGY REPORT*  Clinical Data: Myoclonus; numbness and headache.  CT HEAD WITHOUT CONTRAST  Technique:  Contiguous axial images were obtained from the base of the skull through the vertex without contrast.  Comparison: CT of the head performed 09/14/2007, and MRI/MRA of the brain performed 09/22/2007  Findings: There is no evidence of acute infarction, mass lesion, or intra- or extra-axial hemorrhage on CT.  The posterior fossa, including the cerebellum, brainstem and fourth ventricle, is within normal limits.  The third and lateral ventricles, and basal ganglia are unremarkable in appearance.  The cerebral hemispheres are symmetric in appearance, with normal gray- white differentiation.  No mass effect or midline shift is seen.  There is no evidence of fracture; visualized osseous structures are unremarkable in appearance.  The visualized portions of the orbits are within normal limits.  The paranasal sinuses and mastoid air cells are well-aerated.  No significant soft tissue abnormalities are seen.  IMPRESSION: Unremarkable noncontrast CT of the head.  Original Report Authenticated By: Tonia Ghent,  M.D.     1. Myoclonic jerking       MDM  Patient with significant history of cervical stenosis status post surgery in 2009. Presents with myoclonic jerking. Given his history I feel the etiology is likely spinal cord in origin. I have little concern that this is brain related pathology or neuromuscular disorder. His electrolytes are normal. CT is negative. He is awaiting MRI of the cervical and thoracic spines. He'll be placed in the observation unit for further symptom management. Once the MRIs are resulted we'll discuss the case with neurosurgery.  I discussed the case with my colleagues Dr Weldon Inches and Fayrene Helper PA-C who will follow up on the results and provide the appropriate disposition.        Dayton Bailiff, MD 04/22/11 [phone]

## 2011-04-25 NOTE — ED Provider Notes (Signed)
History/physical exam/procedure(s) were performed by non-physician practitioner and as supervising physician I was immediately available for consultation/collaboration. I have reviewed all notes and am in agreement with care and plan.   Hilario Quarry, MD 04/25/11 2031

## 2011-04-28 ENCOUNTER — Other Ambulatory Visit: Payer: Self-pay | Admitting: Neurosurgery

## 2011-04-28 ENCOUNTER — Encounter (HOSPITAL_COMMUNITY): Payer: Self-pay | Admitting: Pharmacy Technician

## 2011-05-05 ENCOUNTER — Encounter (HOSPITAL_COMMUNITY)
Admission: RE | Admit: 2011-05-05 | Discharge: 2011-05-05 | Disposition: A | Discharge status: Home / Self Care | Payer: BC Managed Care – PPO | Source: Ambulatory Visit | Attending: Neurosurgery | Admitting: Neurosurgery

## 2011-05-05 ENCOUNTER — Encounter (HOSPITAL_COMMUNITY): Payer: Self-pay

## 2011-05-05 HISTORY — DX: Unspecified osteoarthritis, unspecified site: M19.90

## 2011-05-05 HISTORY — DX: Cerebral infarction, unspecified: I63.9

## 2011-05-05 LAB — SURGICAL PCR SCREEN
MRSA, PCR: NEGATIVE
Staphylococcus aureus: NEGATIVE

## 2011-05-05 LAB — CBC
Platelets: 233 10*3/uL (ref 150–400)
RBC: 5.19 MIL/uL (ref 4.22–5.81)
RDW: 12 % (ref 11.5–15.5)
WBC: 8.1 10*3/uL (ref 4.0–10.5)

## 2011-05-05 NOTE — Pre-Procedure Instructions (Addendum)
20 Joseph Butler  05/05/2011   Your procedure is scheduled on:  Thursday January 31st.  Report to [facility] at 10:15 AM.  Call this number if you have problems the morning of surgery: [phone]   Remember:   Do not eat food:After Midnight.   May have clear liquids: up to 4 Hours before arrival.    6:15 am  Clear liquids include soda, tea, black coffee, apple or grape juice, broth.  Take these medicines the morning of surgery with A SIP OF WATER: Claritin Discontinue aspirin, Plavix, Effert and Herbal medications.  Do not wear jewelry, make-up or nail polish.  Do not wear lotions, powders, or perfumes. You may wear deodorant.  Do not shave 48 hours prior to surgery.  Do not bring valuables to the hospital.  Contacts, dentures or bridgework may not be worn into surgery.  Leave suitcase in the car. After surgery it may be brought to your room.  For patients admitted to the hospital, checkout time is 11:00 AM the day of discharge.   Patients discharged the day of surgery will not be allowed to drive home.  Name and phone number of your driver: --  Special Instructions: CHG Shower Use Special Wash: 1/2 bottle night before surgery and 1/2 bottle morning of surgery.   Please read over the following fact sheets that you were given: Pain Booklet, Coughing and Deep Breathing, MRSA Information and Surgical Site Infection Prevention

## 2011-05-07 MED ORDER — CEFAZOLIN SODIUM-DEXTROSE 2-3 GM-% IV SOLR
2.0000 g | INTRAVENOUS | Status: AC
  Administered 2011-05-08: 2 g via INTRAVENOUS
  Filled 2011-05-07: qty 50

## 2011-05-08 ENCOUNTER — Encounter (HOSPITAL_COMMUNITY): Payer: Self-pay | Admitting: *Deleted

## 2011-05-08 ENCOUNTER — Encounter (HOSPITAL_COMMUNITY): Payer: Self-pay | Admitting: Anesthesiology

## 2011-05-08 ENCOUNTER — Ambulatory Visit (HOSPITAL_COMMUNITY)
Admission: RE | Admit: 2011-05-08 | Discharge: 2011-05-09 | Disposition: A | Discharge status: Home / Self Care | Payer: BC Managed Care – PPO | Source: Ambulatory Visit | Attending: Neurosurgery | Admitting: Neurosurgery

## 2011-05-08 ENCOUNTER — Encounter (HOSPITAL_COMMUNITY)
Admission: RE | Disposition: A | Discharge status: Home / Self Care | Payer: Self-pay | Source: Ambulatory Visit | Attending: Neurosurgery

## 2011-05-08 ENCOUNTER — Ambulatory Visit (HOSPITAL_COMMUNITY): Payer: BC Managed Care – PPO | Admitting: Anesthesiology

## 2011-05-08 ENCOUNTER — Ambulatory Visit (HOSPITAL_COMMUNITY): Payer: BC Managed Care – PPO

## 2011-05-08 DIAGNOSIS — M47812 Spondylosis without myelopathy or radiculopathy, cervical region: Secondary | ICD-10-CM | POA: Insufficient documentation

## 2011-05-08 DIAGNOSIS — Z79899 Other long term (current) drug therapy: Secondary | ICD-10-CM | POA: Insufficient documentation

## 2011-05-08 DIAGNOSIS — M5030 Other cervical disc degeneration, unspecified cervical region: Secondary | ICD-10-CM | POA: Insufficient documentation

## 2011-05-08 DIAGNOSIS — M5020 Other cervical disc displacement, unspecified cervical region: Secondary | ICD-10-CM | POA: Insufficient documentation

## 2011-05-08 HISTORY — PX: ANTERIOR CERVICAL DECOMP/DISCECTOMY FUSION: SHX1161

## 2011-05-08 SURGERY — ANTERIOR CERVICAL DECOMPRESSION/DISCECTOMY FUSION 1 LEVEL
Anesthesia: General

## 2011-05-08 MED ORDER — CEFAZOLIN SODIUM 1-5 GM-% IV SOLN
1.0000 g | Freq: Three times a day (TID) | INTRAVENOUS | Status: AC
  Administered 2011-05-08 – 2011-05-09 (×2): 1 g via INTRAVENOUS
  Filled 2011-05-08 (×2): qty 50

## 2011-05-08 MED ORDER — BUPIVACAINE HCL (PF) 0.25 % IJ SOLN
INTRAMUSCULAR | Status: DC | PRN
  Administered 2011-05-08: 5 mL

## 2011-05-08 MED ORDER — HYDROMORPHONE HCL PF 1 MG/ML IJ SOLN
INTRAMUSCULAR | Status: AC
  Filled 2011-05-08: qty 1

## 2011-05-08 MED ORDER — ACETAMINOPHEN 650 MG RE SUPP: 650.0000 mg | RECTAL | Status: DC | PRN

## 2011-05-08 MED ORDER — BACITRACIN 50000 UNITS IM SOLR
INTRAMUSCULAR | Status: AC
  Filled 2011-05-08: qty 1

## 2011-05-08 MED ORDER — PHENOL 1.4 % MT LIQD: 1.0000 | OROMUCOSAL | Status: DC | PRN

## 2011-05-08 MED ORDER — LIDOCAINE-EPINEPHRINE 1 %-1:100000 IJ SOLN
INTRAMUSCULAR | Status: DC | PRN
  Administered 2011-05-08: 5 mL

## 2011-05-08 MED ORDER — DOCUSATE SODIUM 100 MG PO CAPS
100.0000 mg | ORAL_CAPSULE | Freq: Two times a day (BID) | ORAL | Status: DC
  Administered 2011-05-08: 100 mg via ORAL
  Filled 2011-05-08: qty 1

## 2011-05-08 MED ORDER — LORATADINE 10 MG PO TABS
10.0000 mg | ORAL_TABLET | Freq: Every day | ORAL | Status: DC | PRN
  Filled 2011-05-08: qty 1

## 2011-05-08 MED ORDER — MEPERIDINE HCL 25 MG/ML IJ SOLN: 6.2500 mg | INTRAMUSCULAR | Status: DC | PRN

## 2011-05-08 MED ORDER — DIAZEPAM 5 MG PO TABS: 5.0000 mg | ORAL_TABLET | Freq: Three times a day (TID) | ORAL | Status: DC | PRN

## 2011-05-08 MED ORDER — HYDROCODONE-ACETAMINOPHEN 5-325 MG PO TABS: 1.0000 | ORAL_TABLET | ORAL | Status: DC | PRN

## 2011-05-08 MED ORDER — KCL IN DEXTROSE-NACL 20-5-0.45 MEQ/L-%-% IV SOLN
INTRAVENOUS | Status: DC
  Filled 2011-05-08 (×3): qty 1000

## 2011-05-08 MED ORDER — PROPOFOL 10 MG/ML IV EMUL
INTRAVENOUS | Status: DC | PRN
  Administered 2011-05-08: 250 mg via INTRAVENOUS
  Administered 2011-05-08: 50 mg via INTRAVENOUS

## 2011-05-08 MED ORDER — DIAZEPAM 5 MG PO TABS
5.0000 mg | ORAL_TABLET | Freq: Four times a day (QID) | ORAL | Status: DC | PRN
  Administered 2011-05-08 – 2011-05-09 (×3): 5 mg via ORAL
  Filled 2011-05-08 (×3): qty 1

## 2011-05-08 MED ORDER — HYDROMORPHONE HCL PF 1 MG/ML IJ SOLN
0.2500 mg | INTRAMUSCULAR | Status: DC | PRN
  Administered 2011-05-08 (×2): 0.5 mg via INTRAVENOUS

## 2011-05-08 MED ORDER — SODIUM CHLORIDE 0.9 % IV SOLN
INTRAVENOUS | Status: AC
  Filled 2011-05-08: qty 500

## 2011-05-08 MED ORDER — DEXAMETHASONE SODIUM PHOSPHATE 10 MG/ML IJ SOLN
INTRAMUSCULAR | Status: DC | PRN
  Administered 2011-05-08: 10 mg via INTRAVENOUS

## 2011-05-08 MED ORDER — ONDANSETRON HCL 4 MG/2ML IJ SOLN: 4.0000 mg | Freq: Once | INTRAMUSCULAR | Status: DC | PRN

## 2011-05-08 MED ORDER — MENTHOL 3 MG MT LOZG: 1.0000 | LOZENGE | OROMUCOSAL | Status: DC | PRN

## 2011-05-08 MED ORDER — GLYCOPYRROLATE 0.2 MG/ML IJ SOLN
INTRAMUSCULAR | Status: DC | PRN
  Administered 2011-05-08: .6 mg via INTRAVENOUS

## 2011-05-08 MED ORDER — SODIUM CHLORIDE 0.9 % IJ SOLN
3.0000 mL | Freq: Two times a day (BID) | INTRAMUSCULAR | Status: DC
  Administered 2011-05-08: 3 mL via INTRAVENOUS

## 2011-05-08 MED ORDER — ROCURONIUM BROMIDE 100 MG/10ML IV SOLN
INTRAVENOUS | Status: DC | PRN
  Administered 2011-05-08: 40 mg via INTRAVENOUS

## 2011-05-08 MED ORDER — THROMBIN 5000 UNITS EX SOLR
CUTANEOUS | Status: DC | PRN
  Administered 2011-05-08: 5000 [IU] via TOPICAL

## 2011-05-08 MED ORDER — HEMOSTATIC AGENTS (NO CHARGE) OPTIME
TOPICAL | Status: DC | PRN
  Administered 2011-05-08: 1 via TOPICAL

## 2011-05-08 MED ORDER — MORPHINE SULFATE 2 MG/ML IJ SOLN: 0.0500 mg/kg | INTRAMUSCULAR | Status: DC | PRN

## 2011-05-08 MED ORDER — OXYCODONE-ACETAMINOPHEN 10-325 MG PO TABS: 1.0000 | ORAL_TABLET | ORAL | Status: DC | PRN

## 2011-05-08 MED ORDER — 0.9 % SODIUM CHLORIDE (POUR BTL) OPTIME
TOPICAL | Status: DC | PRN
  Administered 2011-05-08: 1000 mL

## 2011-05-08 MED ORDER — ONDANSETRON HCL 4 MG/2ML IJ SOLN
INTRAMUSCULAR | Status: DC | PRN
  Administered 2011-05-08: 4 mg via INTRAVENOUS

## 2011-05-08 MED ORDER — SODIUM CHLORIDE 0.9 % IR SOLN
Status: DC | PRN
  Administered 2011-05-08: 13:00:00

## 2011-05-08 MED ORDER — ZOLPIDEM TARTRATE 5 MG PO TABS: 10.0000 mg | ORAL_TABLET | Freq: Every evening | ORAL | Status: DC | PRN

## 2011-05-08 MED ORDER — MORPHINE SULFATE 4 MG/ML IJ SOLN: 1.0000 mg | INTRAMUSCULAR | Status: DC | PRN

## 2011-05-08 MED ORDER — ACETAMINOPHEN 325 MG PO TABS: 650.0000 mg | ORAL_TABLET | ORAL | Status: DC | PRN

## 2011-05-08 MED ORDER — NEOSTIGMINE METHYLSULFATE 1 MG/ML IJ SOLN
INTRAMUSCULAR | Status: DC | PRN
  Administered 2011-05-08: 4 mg via INTRAVENOUS

## 2011-05-08 MED ORDER — SODIUM CHLORIDE 0.9 % IJ SOLN: 3.0000 mL | INTRAMUSCULAR | Status: DC | PRN

## 2011-05-08 MED ORDER — ONDANSETRON HCL 4 MG/2ML IJ SOLN: 4.0000 mg | INTRAMUSCULAR | Status: DC | PRN

## 2011-05-08 MED ORDER — FENTANYL CITRATE 0.05 MG/ML IJ SOLN
INTRAMUSCULAR | Status: DC | PRN
  Administered 2011-05-08 (×3): 50 ug via INTRAVENOUS
  Administered 2011-05-08: 100 ug via INTRAVENOUS

## 2011-05-08 MED ORDER — OXYCODONE-ACETAMINOPHEN 5-325 MG PO TABS
1.0000 | ORAL_TABLET | ORAL | Status: DC | PRN
  Administered 2011-05-08 – 2011-05-09 (×3): 2 via ORAL
  Filled 2011-05-08 (×3): qty 2

## 2011-05-08 MED ORDER — LACTATED RINGERS IV SOLN
INTRAVENOUS | Status: DC | PRN
  Administered 2011-05-08 (×2): via INTRAVENOUS

## 2011-05-08 MED ORDER — SENNA 8.6 MG PO TABS: 1.0000 | ORAL_TABLET | Freq: Every day | ORAL | Status: DC | PRN

## 2011-05-08 MED ORDER — MIDAZOLAM HCL 5 MG/5ML IJ SOLN
INTRAMUSCULAR | Status: DC | PRN
  Administered 2011-05-08 (×2): 1 mg via INTRAVENOUS

## 2011-05-08 SURGICAL SUPPLY — 67 items
BAG DECANTER FOR FLEXI CONT (MISCELLANEOUS) ×2 IMPLANT
BANDAGE GAUZE ELAST BULKY 4 IN (GAUZE/BANDAGES/DRESSINGS) IMPLANT
BENZOIN TINCTURE PRP APPL 2/3 (GAUZE/BANDAGES/DRESSINGS) IMPLANT
BIT DRILL 14MM (INSTRUMENTS) ×1 IMPLANT
BIT DRILL NEURO 2X3.1 SFT TUCH (MISCELLANEOUS) ×1 IMPLANT
BLADE ULTRA TIP 2M (BLADE) IMPLANT
BUR BARREL STRAIGHT FLUTE 4.0 (BURR) ×2 IMPLANT
CANISTER SUCTION 2500CC (MISCELLANEOUS) ×2 IMPLANT
CLOTH BEACON ORANGE TIMEOUT ST (SAFETY) ×2 IMPLANT
CONT SPEC 4OZ CLIKSEAL STRL BL (MISCELLANEOUS) ×2 IMPLANT
COVER MAYO STAND STRL (DRAPES) ×2 IMPLANT
DERMABOND ADVANCED (GAUZE/BANDAGES/DRESSINGS) ×1
DERMABOND ADVANCED .7 DNX12 (GAUZE/BANDAGES/DRESSINGS) ×1 IMPLANT
DRAPE LAPAROTOMY 100X72 PEDS (DRAPES) ×2 IMPLANT
DRAPE MICROSCOPE LEICA (MISCELLANEOUS) ×2 IMPLANT
DRAPE POUCH INSTRU U-SHP 10X18 (DRAPES) ×2 IMPLANT
DRAPE PROXIMA HALF (DRAPES) ×2 IMPLANT
DRESSING TELFA 8X3 (GAUZE/BANDAGES/DRESSINGS) IMPLANT
DRILL 14MM (INSTRUMENTS) ×2
DRILL NEURO 2X3.1 SOFT TOUCH (MISCELLANEOUS) ×2
DURAPREP 6ML APPLICATOR 50/CS (WOUND CARE) ×2 IMPLANT
ELECT COATED BLADE 2.86 ST (ELECTRODE) ×2 IMPLANT
ELECT REM PT RETURN 9FT ADLT (ELECTROSURGICAL) ×2
ELECTRODE REM PT RTRN 9FT ADLT (ELECTROSURGICAL) ×1 IMPLANT
GAUZE SPONGE 4X4 16PLY XRAY LF (GAUZE/BANDAGES/DRESSINGS) IMPLANT
GLOVE BIO SURGEON STRL SZ8 (GLOVE) ×2 IMPLANT
GLOVE BIOGEL PI IND STRL 8 (GLOVE) ×2 IMPLANT
GLOVE BIOGEL PI IND STRL 8.5 (GLOVE) ×1 IMPLANT
GLOVE BIOGEL PI INDICATOR 8 (GLOVE) ×2
GLOVE BIOGEL PI INDICATOR 8.5 (GLOVE) ×1
GLOVE ECLIPSE 7.5 STRL STRAW (GLOVE) ×4 IMPLANT
GLOVE EXAM NITRILE LRG STRL (GLOVE) IMPLANT
GLOVE EXAM NITRILE MD LF STRL (GLOVE) IMPLANT
GLOVE EXAM NITRILE XL STR (GLOVE) IMPLANT
GLOVE EXAM NITRILE XS STR PU (GLOVE) IMPLANT
GLOVE INDICATOR 8.0 STRL GRN (GLOVE) ×2 IMPLANT
GLOVE INDICATOR 8.5 STRL (GLOVE) ×4 IMPLANT
GOWN BRE IMP SLV AUR LG STRL (GOWN DISPOSABLE) IMPLANT
GOWN BRE IMP SLV AUR XL STRL (GOWN DISPOSABLE) IMPLANT
GOWN STRL REIN 2XL LVL4 (GOWN DISPOSABLE) IMPLANT
GRAFT CERVICAL 9MM (Orthopedic Implant) ×2 IMPLANT
HEAD HALTER (SOFTGOODS) ×2 IMPLANT
KIT BASIN OR (CUSTOM PROCEDURE TRAY) ×2 IMPLANT
KIT ROOM TURNOVER OR (KITS) ×2 IMPLANT
NEEDLE HYPO 18GX1.5 BLUNT FILL (NEEDLE) ×2 IMPLANT
NEEDLE HYPO 25X1 1.5 SAFETY (NEEDLE) ×2 IMPLANT
NEEDLE SPNL 22GX3.5 QUINCKE BK (NEEDLE) ×2 IMPLANT
NS IRRIG 1000ML POUR BTL (IV SOLUTION) ×2 IMPLANT
PACK LAMINECTOMY NEURO (CUSTOM PROCEDURE TRAY) ×2 IMPLANT
PAD ARMBOARD 7.5X6 YLW CONV (MISCELLANEOUS) ×6 IMPLANT
PIN DISTRACTION 14MM (PIN) ×4 IMPLANT
PLATE 14MM (Plate) ×2 IMPLANT
PUTTY BONE 1CC ×2 IMPLANT
RUBBERBAND STERILE (MISCELLANEOUS) ×4 IMPLANT
SCREW 12MM (Screw) ×8 IMPLANT
SPONGE GAUZE 4X4 12PLY (GAUZE/BANDAGES/DRESSINGS) IMPLANT
SPONGE INTESTINAL PEANUT (DISPOSABLE) ×2 IMPLANT
SPONGE SURGIFOAM ABS GEL SZ50 (HEMOSTASIS) ×2 IMPLANT
STAPLER SKIN PROX WIDE 3.9 (STAPLE) ×2 IMPLANT
STRIP CLOSURE SKIN 1/2X4 (GAUZE/BANDAGES/DRESSINGS) IMPLANT
SUT VIC AB 3-0 SH 8-18 (SUTURE) ×2 IMPLANT
SYR 20ML ECCENTRIC (SYRINGE) ×2 IMPLANT
SYR 3ML LL SCALE MARK (SYRINGE) ×2 IMPLANT
TOWEL OR 17X24 6PK STRL BLUE (TOWEL DISPOSABLE) ×2 IMPLANT
TOWEL OR 17X26 10 PK STRL BLUE (TOWEL DISPOSABLE) ×2 IMPLANT
TRAP SPECIMEN MUCOUS 40CC (MISCELLANEOUS) ×2 IMPLANT
WATER STERILE IRR 1000ML POUR (IV SOLUTION) ×2 IMPLANT

## 2011-05-08 NOTE — Interval H&P Note (Signed)
History and Physical Interval Note:  05/08/2011 9:47 AM  Joseph Butler  has presented today for surgery, with the diagnosis of cervical spondylosis cervical herniated disc cervical degenerative disc disease cervical stenosis  The various methods of treatment have been discussed with the patient and family. After consideration of risks, benefits and other options for treatment, the patient has consented to  Procedure(s): ANTERIOR CERVICAL DECOMPRESSION/DISCECTOMY FUSION 1 LEVEL as a surgical intervention .  The patients' history has been reviewed, patient examined, no change in status, stable for surgery.  I have reviewed the patients' chart and labs.  Questions were answered to the patient's satisfaction.     Janki Dike D  Date of Initial H&P: 04/25/2011  History reviewed, patient examined, no change in status, stable for surgery.

## 2011-05-08 NOTE — Transfer of Care (Signed)
Immediate Anesthesia Transfer of Care Note  Patient: Joseph Butler  Procedure(s) Performed:  ANTERIOR CERVICAL DECOMPRESSION/DISCECTOMY FUSION 1 LEVEL - Cervical six-seven anterior cervical decompression with fusion interbody prothesis plating and bonegraft  Patient Location: PACU  Anesthesia Type: General  Level of Consciousness: awake and patient cooperative  Airway & Oxygen Therapy: Patient Spontanous Breathing and Patient connected to nasal cannula oxygen  Post-op Assessment: Report given to PACU RN, Post -op Vital signs reviewed and stable, Patient moving all extremities and Patient able to stick tongue midline  Post vital signs: Reviewed and stable  Complications: No apparent anesthesia complications

## 2011-05-08 NOTE — Preoperative (Signed)
Beta Blockers   Reason not to administer Beta Blockers:Not Applicable 

## 2011-05-08 NOTE — Anesthesia Preprocedure Evaluation (Signed)
Anesthesia Evaluation  Patient identified by MRN, date of birth, ID band Patient awake    Reviewed: Allergy & Precautions, H&P , NPO status , Patient's Chart, lab work & pertinent test results  Airway Mallampati: II TM Distance: >3 FB Neck ROM: Limited    Dental  (+) Teeth Intact and Dental Advisory Given   Pulmonary  clear to auscultation        Cardiovascular Regular Normal    Neuro/Psych    GI/Hepatic   Endo/Other    Renal/GU      Musculoskeletal   Abdominal   Peds  Hematology   Anesthesia Other Findings   Reproductive/Obstetrics                           Anesthesia Physical Anesthesia Plan  ASA: II  Anesthesia Plan: General   Post-op Pain Management:    Induction: Intravenous  Airway Management Planned: Oral ETT and Video Laryngoscope Planned  Additional Equipment:   Intra-op Plan:   Post-operative Plan: Extubation in OR  Informed Consent: I have reviewed the patients History and Physical, chart, labs and discussed the procedure including the risks, benefits and alternatives for the proposed anesthesia with the patient or authorized representative who has indicated his/her understanding and acceptance.   Dental advisory given  Plan Discussed with: CRNA, Anesthesiologist and Surgeon  Anesthesia Plan Comments:         Anesthesia Quick Evaluation

## 2011-05-08 NOTE — H&P (Signed)
Joseph Butler   DOB:  December 28, 1955 #[id number] 04/25/2011:  Joseph Butler comes in today.  I have not seen him since December of 2009.  He went to the emergency room on 04/22/2011 with tremors/pain upon lying down in the evening. He says that since the surgery in 2009 he has had "jerks" when relaxing after work, which have been worse over the last year, and also lately he has had to pause before starting to walk.  He has also noted some problems with balance issues lately.  On further discussion with him and his wife, he note that the problems that he had with his gait and with the jerking predated his surgery and significantly improved with surgery, but did not completely resolve.  He does note problems with persistent spasticity and has some difficulty with is walking.  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  He says he has a hard time using his hands and that this has been getting a little bit more difficult recently, and he feels that over the last six months this has been progressive.    Currently he is taking Loratadine at home for allergies and he was prescribed Valium and Percocet in the emergency room.  He takes Valium 5 mg. twice daily and Percocet 10/325 up to four times daily.    I examined him today and he appears to have a positive Lhermitte's sign with axial compression, which is increased with flexion and extension of is cervical spine.  He appears to have full strength on confrontational testing in his upper and lower extremities, but he is quite hyperreflexic with bilaterally positive Hoffmann's signs, 3+ in the upper extremities, 3+ at the knees, with crossed adductors and suprapatellar reflexes, 3 at the ankles bilaterally, downgoing great toe on the right, upgoing on the left.  He walks with a spastic gait.  He notes generally that he has decreased pin sensation in his upper and lower extremities bilaterally without laterality, but this is clearly  significantly different from his face and upper neck.  He has a well healed anterior cervical incision in the middle of his neck on the left.   I reviewed Joseph Butler MRI of his cervical spine and also reviewed plain radiographs. These show a solid previous anterior cervical decompression and fusion with what appears to be a solid arthrodesis C3 through C6 levels.  In addition, he has spondylosis at C6-7.  MRI demonstrates some mild stenosis at C2-3 without cord compression, but with some significant stenosis at C6-7, which appears worse than his preoperative imaging studies.  He has cord signal abnormality of the C5-6 level and there appears to have been significant effective decompression of his cervical spinal cord from C3 through C6 levels.    I had a lengthy discussion with [name] and his wife about his current problem.  I explained that in light of the fact that he has 7.5 mm. spinal canal at the C6-7 level, that going ahead with decompression and fusion at this level would be appropriate, especially with his noting progressive worsening neurologically.  I also explained that I am not certain that this is the entire basis for his worsening and it may be related to his previous spinal cord injury and progressive deterioration as a result of that, and that surgery on his neck may not improve the situation. However, the other is not treatable and this is treatable and he says he does not want  to have progressive worsening and wants to see if anything can be done.  Because of this, I think he should go ahead with an anterior cervical decompression and fusion at the C6-7 level. I believe that this can be done by abutting a new plate against the previously placed hardware and not having to remove the previously placed hardware.  He is anxious to go ahead with surgery and we will plan on setting this up in the near future. This will consist of decompression and fusion at C6-7.  Risks and benefits were  discussed in detail with the patient and he wishes to proceed.          Danae Orleans. 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  It was initially felt to be a transient ischemic attack, but then was felt later to be secondary to cord compression.  He had MRI of his cervical spine while in the hospital which showed severe canal stenosis at C4-5 and C5-6 and moderate to severe predominantly left-sided canal central stenosis at C3-4 and severe left-sided foraminal narrowing at C4-5 and C6-7 and to a greater extent at C3-4 and C5-6.    Mr. [name] was came in today for evaluation.  He has a complaint of a vice-like neck pain.  He notes numbness into the left side of his body.  He says this began on the 12th of May.  He notes shakiness in his right arm and right leg.  He had onset of severe neck pain in 08/17/07.  He has been taking Tramadol 50 mg 1 to 2 4 x daily.  He had a sense of chest heaviness and says his stress test was okay.  He has a history of COPD and tobacco usage.  He says he stopped on 5/09.    REVIEW OF SYSTEMS:   Review of Systems was reviewed with the patient.  Pertinent positives include wears glasses, hearing loss, ringing in ears, emphysema, leg weakness, arthritis, neck pain, incoordination in legs.    PAST MEDICAL HISTORY:      Prior Operations and Hospitalizations:  Significant for deviated septum repair age 50.      Medications and Allergies:  He has been on Plavix 75 mg qd, enteric-coated aspirin qd, Zocor 20 mg qd and Tramadol 50 mg 1 to 2 q 6 hours.  HE HAS NO KNOWN DRUG ALLERGIES.      Height and Weight:  He is 6' tall and 172 lbs.    FAMILY HISTORY:    Mother is 65 with atrial fibrillation.  Father is 42 with  epilepsy.    SOCIAL HISTORY:    He denies tobacco, alcohol or drug use.    DIAGNOSTIC STUDIES:   As above.    PHYSICAL EXAMINATION:      General Appearance:  On examination today Mr. Kaus is a pleasant, cooperative man in no acute distress.      Blood Pressure and Pulse:  Blood pressure is 140/90.  Heart rate is 74 and regular.      HEENT - normocephalic, atraumatic.  The pupils are equal, round and reactive to light.  The extraocular muscles are intact.  Sclerae - white.  Conjunctiva - pink.  Oropharynx benign.  Uvula midline.     Neck - negative Lhermitte's sign with axial compression.  He has right Spurling maneuver with tingling into his right hand.      Respiratory - there is normal respiratory effort with good intercostal function.  Lungs are clear to auscultation.  There are no rales, rhonchi or wheezes.      Cardiovascular - the heart has regular rate and rhythm to auscultation.  No murmurs are appreciated.  There is no extremity edema, cyanosis or clubbing.  There are palpable pedal pulses.      Abdomen - soft, nontender, no hepatosplenomegaly appreciated or masses.  There are active bowel sounds.  No guarding or rebound.      Musculoskeletal Examination - the patient is able to walk about the examining room with a normal, casual, heel and toe gait.    NEUROLOGICAL EXAMINATION: The patient is oriented to time, person and place and has good recall of both recent and remote memory with normal attention span and concentration.  The patient speaks with clear and fluent speech and exhibits normal language function and appropriate fund of knowledge.      Cranial Nerve Examination - pupils are equal, round and reactive to light.  Extraocular movements are full.  Visual fields are full to confrontational testing.  Slight facial numbness on the left.  Hearing is intact to finger rub.  Palate is upgoing.  Shoulder shrug is symmetric.  Tongue protrudes in the midline.      Motor  Examination - bilateral hand intrinsic and finger extensor weakness at 4-/5; otherwise, full strength in the upper and lower extremities.      Sensory Examination - decreased pin sensation left greater than right leg, but also with a cervical/thoracic sensory level to pin.      Deep Tendon Reflexes - grossly abnormal with 3+ biceps, 3+ triceps.  Positive Hoffmann's signs.  3+ at the knees, 3 at the ankles.  Great toes are downgoing to plantar stimulation.  He has positive cross adductors and suprapatellar reflexes.    IMPRESSION AND RECOMMENDATIONS:   Audry Pecina is a 56 year old man with cervical myelopathy and cervical stenosis with hyperreflexia and Hoffmann's signs and hyperreflexia in both lower extremities.  He has marked spinal stenosis at C3-4, C4-5 and C5-6 levels.  While he does have some spondylosis at C6-7, he does not appear to have severe cord compression at this level and more foraminal narrowing, but he has no weakness in triceps or C7 mediated nerves.  I recommended that he undergo anterior cervical decompression and fusion C3-4, C4-5 and C5-6 levels.  I have set him up for this on 11/05/07.  He needs to be off of Plavix for at least 10 days prior to surgery and we have fitted him for a hard cervical collar and emphasized the importance of not smoking.    I have recommended to the patient that he undergo anterior cervical diskectomy and fusion with plating.  I went over the diagnostic studies in detail and reviewed surgical models and also discussed the exact nature of the surgical procedure, attendant risks, potential benefits and typical operative and postoperative course.  I discussed the risks of surgery which include, but are not limited to, risks of anesthesia, blood loss, infection, injury to various neck structures including the trachea, esophagus, which could cause temporary or permanent swallowing difficulties and also the potential for perforation of the esophagus which might  require operative intervention, larynx, recurrent  laryngeal nerve, which could cause either temporary or permanent vocal cord paralysis resulting in either temporary or permanent voice changes, injury to cervical nerve roots, which could cause either temporary or permanent arm pain, numbness and/or weakness.  There is a small chance of injury to the spinal cord which could cause paralysis.  There is also the potential for malplacement of instrumentation, fusion failure, need for repeat surgery, degenerative disease at other levels in the neck, failure to relieve the pain, worsening of pain.  I also discussed with the patient that he will lose some neck mobility from the surgery.  It is typical to stay in the hospital overnight after this operation.  Typically he will not be able to drive for 2 weeks after surgery and will come back to see me 2 weeks following surgery with a lateral C-spine x-ray and then for monthly visits to 3 months after surgery.  Generally patients are out of work for 4 to 6 six weeks following surgery.  He will wear a hard collar.     [facility]    Danae Orleans. Venetia Maxon, M.D.  JDS:sv cc: Dr. Delia Heady

## 2011-05-08 NOTE — Op Note (Signed)
05/08/2011  2:34 PM  PATIENT:  Joseph Butler  56 y.o. male  PRE-OPERATIVE DIAGNOSIS:  cervical spondylosis with myelopathy, cervical herniated disc, cervical degenerative disc disease, cervical stenosis  POST-OPERATIVE DIAGNOSIS: cervical spondylosis with myelopathy, cervical herniated disc, cervical degenerative disc disease, cervical stenosis  PROCEDURE:  Procedure(s): ANTERIOR CERVICAL DECOMPRESSION/DISCECTOMY FUSION 1 LEVEL  SURGEON:  Surgeon(s): Dorian Heckle, MD Hewitt Shorts, MD  PHYSICIAN ASSISTANT:   ASSISTANTS: Poteat, RN   ANESTHESIA:   general  EBL:  Total I/O In: 1000 [I.V.:1000] Out: -   BLOOD ADMINISTERED:none  DRAINS: none   LOCAL MEDICATIONS USED:  LIDOCAINE 10CC  SPECIMEN:  No Specimen  DISPOSITION OF SPECIMEN:  N/A  COUNTS:  YES  TOURNIQUET:  * No tourniquets in log *  DICTATION: Patient was brought to operating room and following the smooth and uncomplicated induction of general endotracheal anesthesia his head was placed on a horseshoe head holder he was placed in 5 pounds of Holter traction and his anterior neck was prepped and draped in usual sterile fashion. An incision was made on the left side of midline after infiltrating the skin and subcutaneous tissues with local lidocaine. The platysmal layer was incised and subplatysmal dissection was performed exposing the anterior border sternocleidomastoid muscle. Using blunt dissection the carotid sheath was kept lateral and trachea and esophagus kept medial exposing the anterior cervical spine. Bottom of previous plate at C6 was  exposed and  the C6-7 level was cleared of investing soft tissue. Longus coli muscles were taken down from the anterior cervical spine using electrocautery and key elevator and self-retaining retractor was placed. The interspace was incised and a thorough discectomy was performed. Distraction pins were placed. Uncinate spurs and central spondylitic ridges were drilled down  with a high-speed drill. The spinal cord dura and both C7 nerve roots were widely decompressed. Hemostasis was assured. After trial sizing a 9 mm allograft bone graft was selected and packed with DBM and autograft. Tamped into position and countersunk appropriately. Distraction weight was removed. A 14 mm trestle luxe anterior cervical plate was affixed to the cervical spine with 14 mm variable-angle screws 2 at C6 and 2 at C7. The plate was buttressed against the bottom of the previously placed plate. All screws were well-positioned and locking mechanisms were engaged. Soft tissues were inspected and found to be in good repair. The wound was irrigated. The platysma layer was closed with 3-0 Vicryl stitches and the skin was reapproximated with 3-0 Vicryl subcuticular stitches. The wound was dressed with Dermabond. Counts were correct at the end of the case. Patient was extubated and taken to recovery in stable and satisfactory condition.  PLAN OF CARE: Admit for overnight observation  PATIENT DISPOSITION:  PACU - hemodynamically stable.   Delay start of Pharmacological VTE agent (>24hrs) due to surgical blood loss or risk of bleeding:  YES

## 2011-05-08 NOTE — Anesthesia Postprocedure Evaluation (Signed)
  Anesthesia Post-op Note  Patient: Joseph Butler  Procedure(s) Performed:  ANTERIOR CERVICAL DECOMPRESSION/DISCECTOMY FUSION 1 LEVEL - Cervical six-seven anterior cervical decompression with fusion interbody prothesis plating and bonegraft  Patient Location: NICU  Anesthesia Type: General  Level of Consciousness: awake, alert  and oriented  Airway and Oxygen Therapy: Patient Spontanous Breathing and Patient connected to nasal cannula oxygen  Post-op Pain: mild  Post-op Assessment: Post-op Vital signs reviewed, Patient's Cardiovascular Status Stable, Respiratory Function Stable, Patent Airway, No signs of Nausea or vomiting and Pain level controlled  Post-op Vital Signs: Reviewed and stable  Complications: No apparent anesthesia complications

## 2011-05-09 ENCOUNTER — Encounter (HOSPITAL_COMMUNITY): Payer: Self-pay | Admitting: Neurosurgery

## 2011-05-09 NOTE — Discharge Summary (Signed)
Physician Discharge Summary  Patient ID: Joseph Butler MRN: [id number] DOB/AGE: September 13, 1955 56 y.o.  Admit date: 05/08/2011 Discharge date: 05/09/2011  Admission Diagnoses:  Discharge Diagnoses:  Active Problems:  * No active hospital problems. *    Discharged Condition: good  Hospital Course:  Admission and surgery 05/08/11 for Dx: cervical spondylosis, cervical herniated disc, cervical degenerative disc disease, cervical stenosis. Uncomplicated surgery, uneventful PACU and overnight post-op stays. Symptoms much improved upon discharge 05/09/11.   Consults: None  Significant Diagnostic Studies: radiology: Intraoperative XR only  Treatments: surgery: Anterior Cervical Decompression and Fusion C6-7     Discharge Exam: Blood pressure 110/73, pulse 79, temperature 97.5 F (36.4 C), temperature source Oral, resp. rate 16, SpO2 98.00%. Alert, conversant. Mild pain between scapulae - reassured. Good strength BUE. Improved sensation both hands/fingers. Small raised area between incision & prior ACDF scar. No swelling inferior, medial, or lateral to incision. No dysphagia, no hoarseness. No tenderness to palpation. Dermabond intact. No erythema.  Disposition: Home or Self Care; discharge instructions reviewed with patient & wife. Verbalized understanding. Rx's to pt with dosing as below: Valium 5mg , Percocet 10/325.   Medication List  As of 05/09/2011  7:49 AM   STOP taking these medications         naproxen sodium 220 MG tablet         TAKE these medications         diazepam 5 MG tablet   Commonly known as: VALIUM   Take 5 mg by mouth every 8 (eight) hours as needed.      loratadine 10 MG tablet   Commonly known as: CLARITIN   Take 10 mg by mouth daily as needed. For allergies      oxyCODONE-acetaminophen 10-325 MG per tablet   Commonly known as: PERCOCET   Take 1 tablet by mouth every 4 (four) hours as needed.      senna 8.6 MG Tabs   Commonly known as: SENOKOT   Take 1  tablet by mouth daily as needed.             Signed: Georgiann Cocker 05/09/2011, 7:49 AM

## 2012-04-07 HISTORY — PX: HERNIA REPAIR: SHX51

## 2012-08-20 ENCOUNTER — Emergency Department: Payer: Self-pay | Admitting: Emergency Medicine

## 2012-08-20 LAB — CBC
HCT: 45.1 % (ref 40.0–52.0)
HGB: 16.2 g/dL (ref 13.0–18.0)
MCH: 30.8 pg (ref 26.0–34.0)
MCHC: 35.9 g/dL (ref 32.0–36.0)
RBC: 5.26 10*6/uL (ref 4.40–5.90)
WBC: 6.1 10*3/uL (ref 3.8–10.6)

## 2012-08-20 LAB — BASIC METABOLIC PANEL
Co2: 30 mmol/L (ref 21–32)
Creatinine: 0.91 mg/dL (ref 0.60–1.30)

## 2012-08-27 ENCOUNTER — Other Ambulatory Visit: Payer: Self-pay | Admitting: Neurosurgery

## 2012-08-27 ENCOUNTER — Ambulatory Visit
Admission: RE | Admit: 2012-08-27 | Discharge: 2012-08-27 | Disposition: A | Discharge status: Home / Self Care | Payer: No Typology Code available for payment source | Source: Ambulatory Visit | Attending: Neurosurgery | Admitting: Neurosurgery

## 2012-08-27 DIAGNOSIS — M47812 Spondylosis without myelopathy or radiculopathy, cervical region: Secondary | ICD-10-CM

## 2012-09-10 ENCOUNTER — Encounter: Payer: Self-pay | Admitting: Cardiovascular Disease

## 2012-09-10 ENCOUNTER — Ambulatory Visit (INDEPENDENT_AMBULATORY_CARE_PROVIDER_SITE_OTHER): Payer: No Typology Code available for payment source | Admitting: Cardiovascular Disease

## 2012-09-10 VITALS — BP 159/85 | HR 74 | Ht 72.0 in | Wt 209.0 lb

## 2012-09-10 DIAGNOSIS — R079 Chest pain, unspecified: Secondary | ICD-10-CM

## 2012-09-10 NOTE — Assessment & Plan Note (Signed)
The patient had only one episode of chest pain about 3 weeks ago with no recurrent symptoms since then. His basic workup in the emergency room was negative. His physical exam is unremarkable and baseline EKG is normal. It is highly possible that the episode was related to GERD with associated esophageal spasm related to use of nonsteroidal anti-inflammatory drugs. Given that his symptoms resolved completely, a definitive stress testing is likely low. I offered him this option but will hold off for now. He will notify our office if he develops recurrent symptoms.

## 2012-09-10 NOTE — Progress Notes (Signed)
HPI  This is a 57 year old man who is here today for evaluation of chest pain. He has no previous cardiac history. He reports no history of diabetes or hyperlipidemia. He has been told about the elevated blood pressure but does not have a diagnosis of hypertension and does not take medications. He suffers from chronic pain related to cervical spine degenerative disc disease with herniation and 2 previous surgeries. He has chronic left-sided numbness and weakness. He had an episode of chest pain a few weeks ago. It happened in the morning at rest and lasted for a few hours. It was described as aching and tightness feeling with no radiation. It was not associated with shortness of breath, nausea or diaphoresis. He went to the emergency room at [facility] where his EKG showed no acute changes. Cardiac enzymes were negative. Chest x-ray showed no acute cardiopulmonary disease. At that time before the episode of chest pain, the patient had significant discomfort related to his back and neck situation. He was taking Aleve regularly. He didn't have heartburn and responded to an antacid. Since that episode, he had no further chest discomfort or dyspnea even with physical activities. He is not a smoker and has no family history of premature coronary artery disease.  No Known Allergies   Current Outpatient Prescriptions on File Prior to Visit  Medication Sig Dispense Refill  . loratadine (CLARITIN) 10 MG tablet Take 10 mg by mouth daily as needed. For allergies       No current facility-administered medications on file prior to visit.     Past Medical History  Diagnosis Date  . Back pain   . Stenosis of cervical spine   . Stroke 2009    , Numbness Left  . Arthritis     Knees, Hands     Past Surgical History  Procedure Laterality Date  . Anterior cervical decomp/discectomy fusion  05/08/2011    Procedure: ANTERIOR CERVICAL DECOMPRESSION/DISCECTOMY FUSION 1 LEVEL;  Surgeon: Dorian Heckle, MD;   Location: [facility];  Service: Neurosurgery;  Laterality: N/A;  Cervical six-seven anterior cervical decompression with fusion interbody prothesis plating and bonegraft  . Bone graft      x4  . Neck fusion      x2     Family History  Problem Relation Age of Onset  . Hypertension Mother   . Hypertension Father      History   Social History  . Marital Status: Married    Spouse Name: N/A    Number of Children: N/A  . Years of Education: N/A   Occupational History  . Not on file.   Social History Main Topics  . Smoking status: Former Smoker    Types: Cigarettes  . Smokeless tobacco: Not on file  . Alcohol Use: Yes  . Drug Use: No  . Sexually Active: Not on file   Other Topics Concern  . Not on file   Social History Narrative  . No narrative on file     ROS Constitutional: Negative for fever, chills, diaphoresis, activity change, appetite change and fatigue.  HENT: Negative for hearing loss, nosebleeds, congestion, sore throat, facial swelling, drooling, trouble swallowing, neck pain, voice change, sinus pressure and tinnitus.  Eyes: Negative for photophobia, pain, discharge and visual disturbance.  Respiratory: Negative for apnea, cough,  shortness of breath and wheezing.  Cardiovascular: Negative for palpitations and leg swelling.  Gastrointestinal: Negative for nausea, vomiting, abdominal pain, diarrhea, constipation, blood in stool and abdominal distention.  Genitourinary: Negative for dysuria, urgency, frequency, hematuria and decreased urine volume. Skin: Negative for color change, pallor, rash and wound.  Neurological: Negative for dizziness, tremors, seizures, syncope, speech difficulty, weakness, light-headedness, numbness and headaches.  Psychiatric/Behavioral: Negative for suicidal ideas, hallucinations, behavioral problems and agitation. The patient is not nervous/anxious.     PHYSICAL EXAM   BP 159/85  Pulse 74  Ht 6' (1.829 m)  Wt 209 lb  (94.802 kg)  BMI 28.34 kg/m2 Constitutional: He is oriented to person, place, and time. He appears well-developed and well-nourished. No distress.  HENT: No nasal discharge.  Head: Normocephalic and atraumatic.  Eyes: Pupils are equal and round. Right eye exhibits no discharge. Left eye exhibits no discharge.  Neck: Normal range of motion. Neck supple. No JVD present. No thyromegaly present.  Cardiovascular: Normal rate, regular rhythm, normal heart sounds and. Exam reveals no gallop and no friction rub. No murmur heard.  Pulmonary/Chest: Effort normal and breath sounds normal. No stridor. No respiratory distress. He has no wheezes. He has no rales. He exhibits no tenderness.  Abdominal: Soft. Bowel sounds are normal. He exhibits no distension. There is no tenderness. There is no rebound and no guarding.  Musculoskeletal: Normal range of motion. He exhibits no edema and no tenderness.  Neurological: He is alert and oriented to person, place, and time. Coordination normal.  Skin: Skin is warm and dry. No rash noted. He is not diaphoretic. No erythema. No pallor.  Psychiatric: He has a normal mood and affect. His behavior is normal. Judgment and thought content normal.       EKG: Normal sinus rhythm with sinus arrhythmia. No significant ST or T wave changes.   ASSESSMENT AND PLAN

## 2012-09-10 NOTE — Patient Instructions (Addendum)
Follow up as needed

## 2012-10-01 ENCOUNTER — Ambulatory Visit: Payer: Self-pay | Admitting: Family Medicine

## 2012-11-07 ENCOUNTER — Emergency Department: Payer: Self-pay | Admitting: Emergency Medicine

## 2012-11-07 LAB — CBC
HGB: 16.1 g/dL (ref 13.0–18.0)
MCHC: 35.3 g/dL (ref 32.0–36.0)
Platelet: 232 10*3/uL (ref 150–440)
RBC: 5.26 10*6/uL (ref 4.40–5.90)
WBC: 6.8 10*3/uL (ref 3.8–10.6)

## 2012-11-07 LAB — COMPREHENSIVE METABOLIC PANEL
Alkaline Phosphatase: 109 U/L (ref 50–136)
Anion Gap: 3 — ABNORMAL LOW (ref 7–16)
BUN: 10 mg/dL (ref 7–18)
Calcium, Total: 9.1 mg/dL (ref 8.5–10.1)
Creatinine: 1.04 mg/dL (ref 0.60–1.30)
EGFR (African American): >60
Osmolality: 285 (ref 275–301)
Potassium: 3.9 mmol/L (ref 3.5–5.1)
SGPT (ALT): 56 U/L (ref 12–78)
Sodium: 138 mmol/L (ref 136–145)

## 2012-11-07 LAB — URINALYSIS, COMPLETE
Bilirubin,UR: NEGATIVE
Blood: NEGATIVE
Glucose,UR: >=500 mg/dL (ref 0–75)
Ketone: NEGATIVE
Ph: 7 (ref 4.5–8.0)
RBC,UR: <1 /HPF (ref 0–5)
Specific Gravity: 1.023 (ref 1.003–1.030)

## 2012-11-10 ENCOUNTER — Encounter: Payer: Self-pay | Admitting: General Surgery

## 2012-11-10 ENCOUNTER — Ambulatory Visit (INDEPENDENT_AMBULATORY_CARE_PROVIDER_SITE_OTHER): Payer: No Typology Code available for payment source | Admitting: General Surgery

## 2012-11-10 VITALS — BP 150/82 | HR 78 | Resp 12 | Ht 72.0 in | Wt 204.0 lb

## 2012-11-10 DIAGNOSIS — R194 Change in bowel habit: Secondary | ICD-10-CM

## 2012-11-10 DIAGNOSIS — R198 Other specified symptoms and signs involving the digestive system and abdomen: Secondary | ICD-10-CM

## 2012-11-10 DIAGNOSIS — K4020 Bilateral inguinal hernia, without obstruction or gangrene, not specified as recurrent: Secondary | ICD-10-CM | POA: Insufficient documentation

## 2012-11-10 LAB — HEMOCCULT GUIAC POC 1CARD (OFFICE): Fecal Occult Blood, POC: NEGATIVE

## 2012-11-10 NOTE — Patient Instructions (Addendum)
Hernia precautions and incarceration were discussed with the patient. If they develop symptoms of an incarcerated hernia, they were encouraged to seek prompt medical attention.  I have recommended repair of the hernia using mesh on an outpatient basis in the near future. The risk of infection was reviewed. The role of prosthetic mesh to minimize the risk of recurrence was reviewed.  Patient's surgery has been scheduled for 12-03-12 at [facility].

## 2012-11-10 NOTE — Progress Notes (Signed)
Patient ID: Joseph Butler, male   DOB: Jun 13, 1955, 57 y.o.   MRN: [id number]  Chief Complaint  Patient presents with  . Other    hernia    HPI Joseph Butler is a 57 y.o. male. here today for a hernia evaluation. States he noticed it Sunday and went to ER because of the pain in right groin. A CT scan was done.  States the pain now is a dull burning sensation. 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 Last BM was Sunday "flat", normally has a BM 3-4 times a week.   HPI  Past Medical History  Diagnosis Date  . Back pain   . Stenosis of cervical spine   . Stroke 2009    , Numbness Left  . Arthritis     Knees, Hands    Past Surgical History  Procedure Laterality Date  . Anterior cervical decomp/discectomy fusion  05/08/2011    Procedure: ANTERIOR CERVICAL DECOMPRESSION/DISCECTOMY FUSION 1 LEVEL;  Surgeon: Dorian Heckle, MD;  Location: [facility];  Service: Neurosurgery;  Laterality: N/A;  Cervical six-seven anterior cervical decompression with fusion interbody prothesis plating and bonegraft  . Bone graft      x4  . Neck fusion      x2    Family History  Problem Relation Age of Onset  . Hypertension Mother   . Hypertension Father     Social History History  Substance Use Topics  . Smoking status: Former Smoker -- 1.00 packs/day for 30 years    Types: Cigarettes    Quit date: 04/08/2007  . Smokeless tobacco: Never Used  . Alcohol Use: 1.0 oz/week    2 drink(s) per week    No Known Allergies  Current Outpatient Prescriptions  Medication Sig Dispense Refill  . baclofen (LIORESAL) 10 MG tablet Take 10 mg by mouth 3 (three) times daily.       . Oxycodone HCl 10 MG TABS as needed.       Marland Kitchen tiZANidine (ZANAFLEX) 4 MG tablet Take 4 mg by mouth 4 (four) times daily.        No current facility-administered medications for this visit.    Review of Systems Review of Systems  Constitutional: Negative.   Respiratory: Negative.   Cardiovascular: Negative.    Gastrointestinal: Negative.     Blood pressure 150/82, pulse 78, resp. rate 12, height 6' (1.829 m), weight 204 lb (92.534 kg).  Physical Exam Physical Exam  Constitutional: He is oriented to person, place, and time. He appears well-developed and well-nourished.  Neck: Neck supple.  Cardiovascular: Normal rate and regular rhythm.   Pulmonary/Chest: Effort normal and breath sounds normal.  Abdominal: Soft. Normal appearance. A hernia is present. Hernia confirmed positive in the right inguinal area (reducible) and confirmed positive in the left inguinal area (reducible).  Genitourinary: Rectum normal and prostate normal. Guaiac negative stool.  Lymphadenopathy:    He has no cervical adenopathy.  Neurological: He is alert and oriented to person, place, and time.  Skin: Skin is warm and dry.  2 well healed incisions on the left anterior neck Diastasis recti Bilateral scrotal  cyst 4cm  Data Reviewed CT scan of the abdomen and pelvis dated 11/07/2012 was reviewed. This was notable for a suspected hemangioma in the left lobe of the liver. A right inguinal hernia containing fat and a portion of the sigmoid colon was evident without evidence of acute obstruction. A small inguinal hernia was identified on  the left.  Scrotal ultrasound dated 10/01/2012 showed no evidence of testicular torsion. Bilateral epidermal cysts were identified. On the right this measured up to just under 4 cm, on the left just over 4.6 cm.  Assessment    Bilateral inguinal hernias. No evidence of obstruction.    Plan    Options for management were reviewed: 1) treatment of the symptomatic side versus 2) bilateral repair.  At this time the patient desires to proceed with bilateral inguinal hernia repairs.  This will likely be best accomplished using a laparoscopic approach.  The risks of the procedure was reviewed. Plan for the use of prosthetic mesh was discussed.     Patient's surgery has been scheduled for  12-03-12 at [facility].   [name] 11/12/2012, 6:27 PM

## 2012-11-12 ENCOUNTER — Other Ambulatory Visit: Payer: Self-pay | Admitting: General Surgery

## 2012-11-12 ENCOUNTER — Encounter: Payer: Self-pay | Admitting: General Surgery

## 2012-11-12 DIAGNOSIS — K4020 Bilateral inguinal hernia, without obstruction or gangrene, not specified as recurrent: Secondary | ICD-10-CM

## 2012-11-25 ENCOUNTER — Telehealth: Payer: Self-pay | Admitting: *Deleted

## 2012-11-25 NOTE — Telephone Encounter (Signed)
Per Caryl-Lyn, Pre-admit testing called yesterday and stated that this patient needs medical clearance prior to surgery that is scheduled for 12-03-12.   Per [facility], patient has an appointment for medical clearance on 11-26-12 at 1:30 pm. Their office has been instructed to fax note once patient is cleared to our office.

## 2012-12-02 ENCOUNTER — Telehealth: Payer: Self-pay | Admitting: *Deleted

## 2012-12-02 NOTE — Telephone Encounter (Signed)
Medical clearance received from [facility]. This has been faxed to Pre-admit and received per Dennie Bible and also to [facility] and received per Clydie Braun.

## 2012-12-03 ENCOUNTER — Ambulatory Visit: Payer: Self-pay | Admitting: General Surgery

## 2012-12-03 DIAGNOSIS — K4020 Bilateral inguinal hernia, without obstruction or gangrene, not specified as recurrent: Secondary | ICD-10-CM

## 2012-12-08 ENCOUNTER — Encounter: Payer: Self-pay | Admitting: General Surgery

## 2012-12-15 ENCOUNTER — Encounter: Payer: Self-pay | Admitting: General Surgery

## 2012-12-15 ENCOUNTER — Ambulatory Visit (INDEPENDENT_AMBULATORY_CARE_PROVIDER_SITE_OTHER): Payer: Self-pay | Admitting: General Surgery

## 2012-12-15 VITALS — BP 136/74 | HR 74 | Resp 14 | Ht 72.0 in | Wt 196.0 lb

## 2012-12-15 DIAGNOSIS — K4020 Bilateral inguinal hernia, without obstruction or gangrene, not specified as recurrent: Secondary | ICD-10-CM

## 2012-12-15 NOTE — Progress Notes (Signed)
Patient ID: Joseph Butler, male   DOB: 08-Jun-1955, 57 y.o.   MRN: [id number]  Chief Complaint  Patient presents with  . Routine Post Op    bilateral inguinal hernia repair    HPI Joseph Butler is a 57 y.o. male who presents for a post op evaluation. The procedure performed was a laparoscopic bilateral inguinall hernia repair. The procedure was done on 12/03/12. The only complaint at this time is some abdominal tenderness. Overall patient is doing well. The patient denies any difficulty with bowel or bladder function.   HPI  Past Medical History  Diagnosis Date  . Back pain   . Stenosis of cervical spine   . Stroke 2009    , Numbness Left  . Arthritis     Knees, Hands    Past Surgical History  Procedure Laterality Date  . Anterior cervical decomp/discectomy fusion  05/08/2011    Procedure: ANTERIOR CERVICAL DECOMPRESSION/DISCECTOMY FUSION 1 LEVEL;  Surgeon: Dorian Heckle, MD;  Location: [facility];  Service: Neurosurgery;  Laterality: N/A;  Cervical six-seven anterior cervical decompression with fusion interbody prothesis plating and bonegraft  . Bone graft      x4  . Neck fusion      x2  . Hernia repair Bilateral 2014    inguinal     Family History  Problem Relation Age of Onset  . Hypertension Mother   . Hypertension Father     Social History History  Substance Use Topics  . Smoking status: Former Smoker -- 1.00 packs/day for 30 years    Types: Cigarettes    Quit date: 04/08/2007  . Smokeless tobacco: Never Used  . Alcohol Use: 1.0 oz/week    2 drink(s) per week    No Known Allergies  Current Outpatient Prescriptions  Medication Sig Dispense Refill  . baclofen (LIORESAL) 10 MG tablet Take 10 mg by mouth 3 (three) times daily.       . carvedilol (COREG) 6.25 MG tablet Take 1 tablet by mouth 2 (two) times daily.      . Oxycodone HCl 10 MG TABS as needed.       Marland Kitchen oxyCODONE-acetaminophen (PERCOCET/ROXICET) 5-325 MG per tablet Take 1 tablet by mouth as needed.        No current facility-administered medications for this visit.    Review of Systems Review of Systems  Constitutional: Negative.   Respiratory: Negative.   Cardiovascular: Negative.   Gastrointestinal: Negative.     Blood pressure 136/74, pulse 74, resp. rate 14, height 6' (1.829 m), weight 196 lb (88.905 kg).  Physical Exam Physical Exam  Constitutional: He is oriented to person, place, and time. He appears well-developed and well-nourished.  Abdominal: Soft. Normal appearance and bowel sounds are normal. There is no hepatosplenomegaly. No hernia.  Incision sites healing well.   Neurological: He is alert and oriented to person, place, and time.  Skin: Skin is warm and dry.   Examination in the standing position shows a faint impulse bilaterally with coughing. No bruising. Percocet is remain unchanged the  Data Reviewed No new data.  Assessment    Doing well status post laparoscopic bilateral inguinal hernia repair.    Plan    Care was lifting was reviewed. He may return to the gym using 1 upper or lower extremity at the time for the next few weeks. Final followup in one month.       [name] 12/17/2012, 4:55 PM

## 2012-12-15 NOTE — Patient Instructions (Addendum)
Patient advised of lifting techniques. Patient to return in 1 month.

## 2012-12-17 ENCOUNTER — Encounter: Payer: Self-pay | Admitting: General Surgery

## 2012-12-31 LAB — BASIC METABOLIC PANEL
BUN: 9 mg/dL (ref 4–21)
CREATININE: 1 mg/dL (ref 0.6–1.3)
Glucose: 124 mg/dL
POTASSIUM: 4.8 mmol/L (ref 3.4–5.3)
Sodium: 144 mmol/L (ref 137–147)

## 2012-12-31 LAB — HEPATIC FUNCTION PANEL
ALK PHOS: 109 U/L (ref 25–125)
AST: 21 U/L (ref 14–40)
Bilirubin, Total: 0.3 mg/dL

## 2012-12-31 LAB — PSA: PSA: 0.7

## 2013-01-26 ENCOUNTER — Ambulatory Visit (INDEPENDENT_AMBULATORY_CARE_PROVIDER_SITE_OTHER): Payer: Self-pay | Admitting: General Surgery

## 2013-01-26 ENCOUNTER — Encounter: Payer: Self-pay | Admitting: General Surgery

## 2013-01-26 VITALS — BP 140/80 | HR 80 | Resp 14 | Ht 71.0 in | Wt 199.0 lb

## 2013-01-26 DIAGNOSIS — K4020 Bilateral inguinal hernia, without obstruction or gangrene, not specified as recurrent: Secondary | ICD-10-CM

## 2013-01-26 NOTE — Progress Notes (Signed)
Patient ID: Joseph Butler, male   DOB: 10/11/1955, 57 y.o.   MRN: [id number]  Chief Complaint  Patient presents with  . Routine Post Op    1 month post op bilateral inguinal hernia repair    HPI Joseph Butler is a 57 y.o. male who presents for a 1 month post op bilateral laparoscopic inguinal hernia repair. The procedure was done on 12/03/12. The patient denies any problems at this time.   HPI  Past Medical History  Diagnosis Date  . Back pain   . Stenosis of cervical spine   . Stroke 2009    , Numbness Left  . Arthritis     Knees, Hands    Past Surgical History  Procedure Laterality Date  . Anterior cervical decomp/discectomy fusion  05/08/2011    Procedure: ANTERIOR CERVICAL DECOMPRESSION/DISCECTOMY FUSION 1 LEVEL;  Surgeon: Dorian Heckle, MD;  Location: [facility];  Service: Neurosurgery;  Laterality: N/A;  Cervical six-seven anterior cervical decompression with fusion interbody prothesis plating and bonegraft  . Bone graft      x4  . Neck fusion      x2  . Hernia repair Bilateral 2014    inguinal     Family History  Problem Relation Age of Onset  . Hypertension Mother   . Hypertension Father     Social History History  Substance Use Topics  . Smoking status: Former Smoker -- 1.00 packs/day for 30 years    Types: Cigarettes    Quit date: 04/08/2007  . Smokeless tobacco: Never Used  . Alcohol Use: 1.0 oz/week    2 drink(s) per week    No Known Allergies  Current Outpatient Prescriptions  Medication Sig Dispense Refill  . amitriptyline (ELAVIL) 25 MG tablet Take 25 mg by mouth at bedtime.      . baclofen (LIORESAL) 10 MG tablet Take 10 mg by mouth 4 (four) times daily.       . carvedilol (COREG) 6.25 MG tablet Take 1 tablet by mouth 2 (two) times daily.      . Oxycodone HCl 10 MG TABS as needed.       Marland Kitchen tiZANidine (ZANAFLEX) 4 MG tablet Take 0.5 tablets by mouth as needed.       No current facility-administered medications for this visit.    Review of  Systems Review of Systems  Constitutional: Negative.   Respiratory: Negative.   Cardiovascular: Negative.   Gastrointestinal: Negative.     Blood pressure 140/80, pulse 80, resp. rate 14, height 5\' 11"  (1.803 m), weight 199 lb (90.266 kg).  Physical Exam Physical Exam  Constitutional: He is oriented to person, place, and time. He appears well-developed and well-nourished.  Neurological: He is alert and oriented to person, place, and time.  Skin: Skin is warm and dry.    Data Reviewed None.  Assessment    Doing well status post bilateral inguinal hernia repair.     Plan    Good judgment was strenuous activity was encouraged. He was encouraged to call if he has any concerns.        [name] 01/26/2013, 9:03 PM

## 2013-01-26 NOTE — Patient Instructions (Signed)
Patient to return as needed. 

## 2013-05-06 LAB — HEMOGLOBIN A1C: HEMOGLOBIN A1C: 6

## 2013-05-13 LAB — LIPID PANEL
CHOLESTEROL: 209 mg/dL — AB (ref 0–200)
HDL: 43 mg/dL (ref 35–70)
LDL Cholesterol: 130 mg/dL
TRIGLYCERIDES: 179 mg/dL — AB (ref 40–160)

## 2013-05-13 LAB — HEPATIC FUNCTION PANEL: ALT: 44 U/L — AB (ref 10–40)

## 2013-07-12 IMAGING — US US PELVIS LIMITED
1 series · 14 of 25 positions shown · non-contrast
Comparison: none

REASON FOR EXAM: swollen painful eval torsion  CALL REPORT  [DATE]
COMMENTS:

[Series 1: us pelvis limited · 0.08mm/px · 14 of 67 slices shown]
[im 1/67]
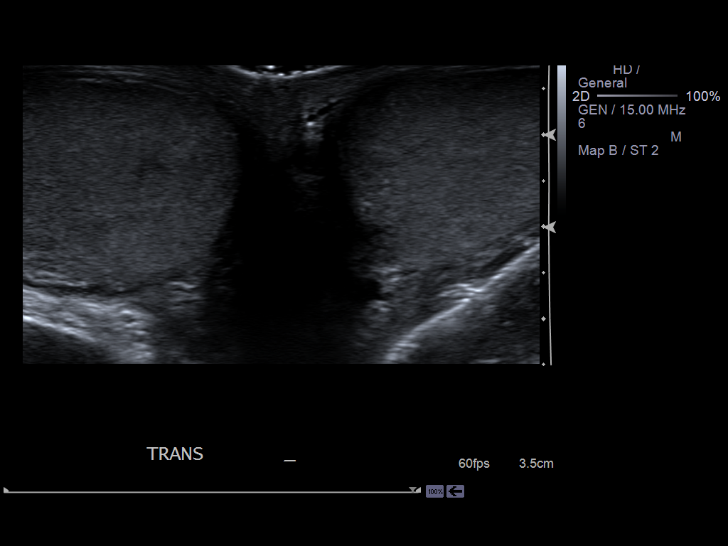
[im 6/67]
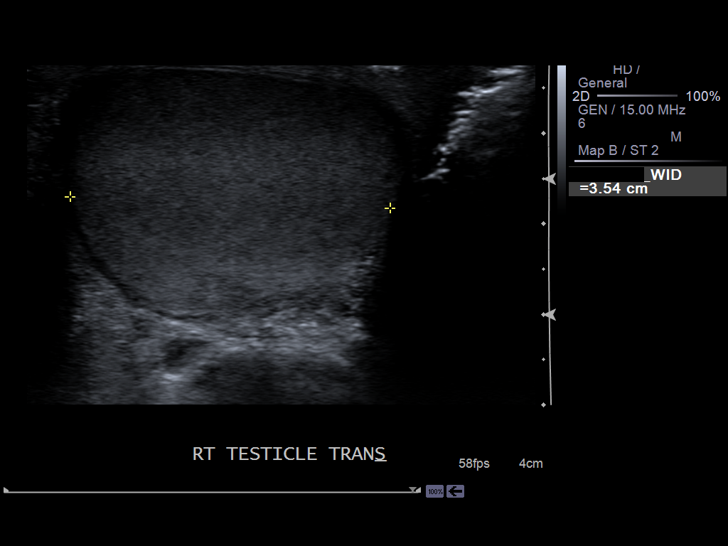
[im 12/67]
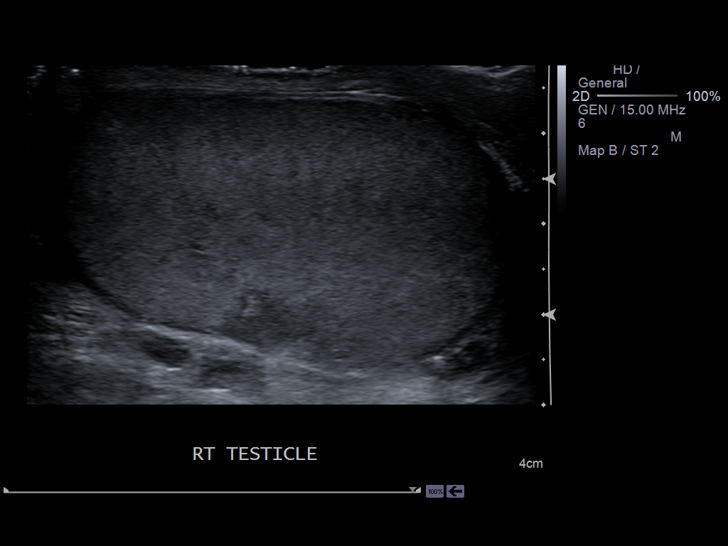
[im 17/67]
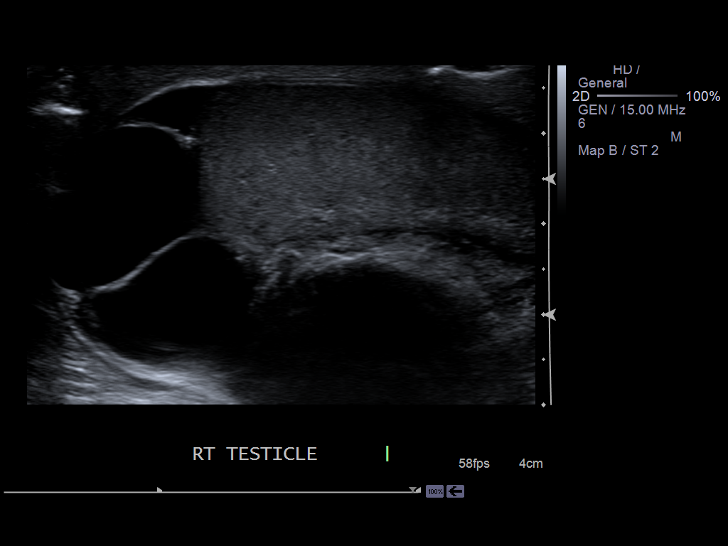
[im 23/67]
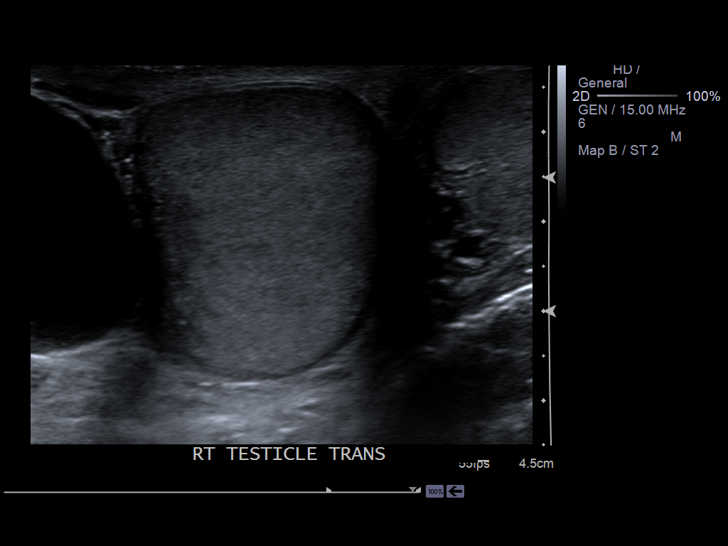
[im 25/67]
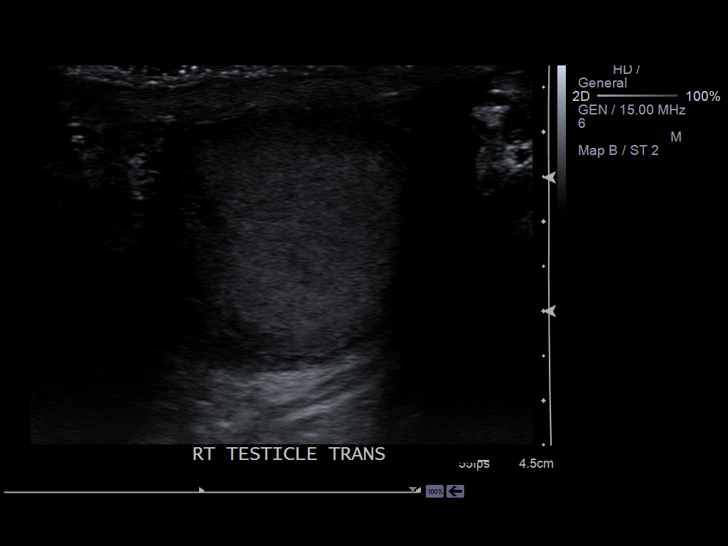
[im 31/67]
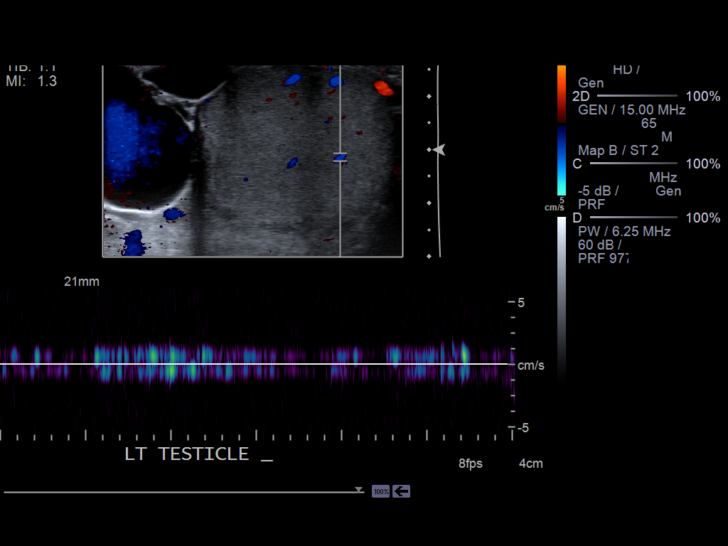
[im 36/67]
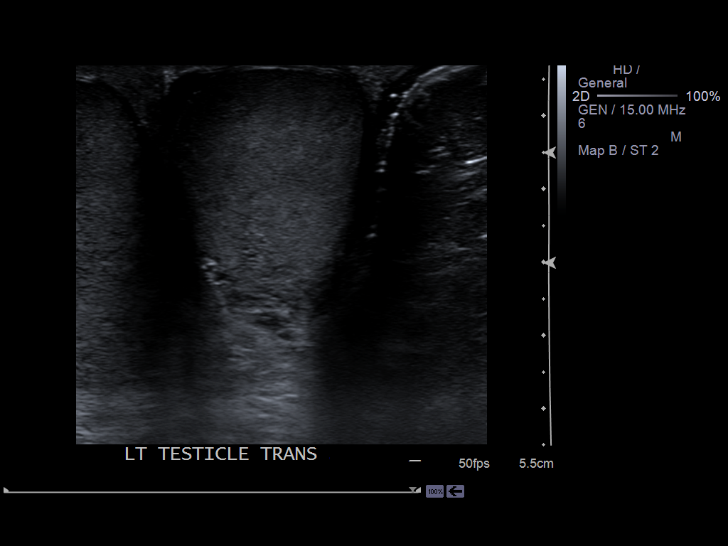
[im 42/67]
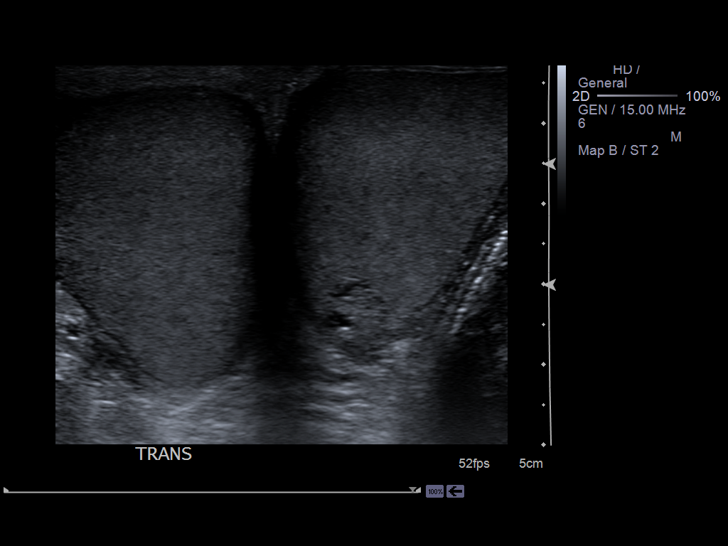
[im 45/67]
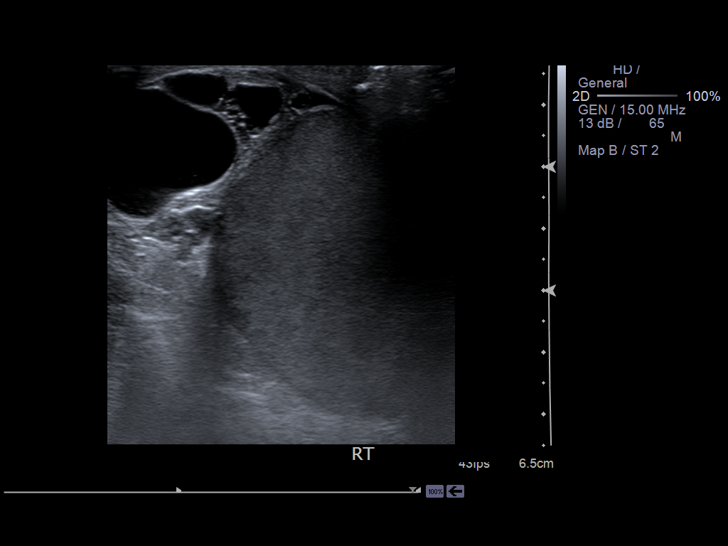
[im 50/67]
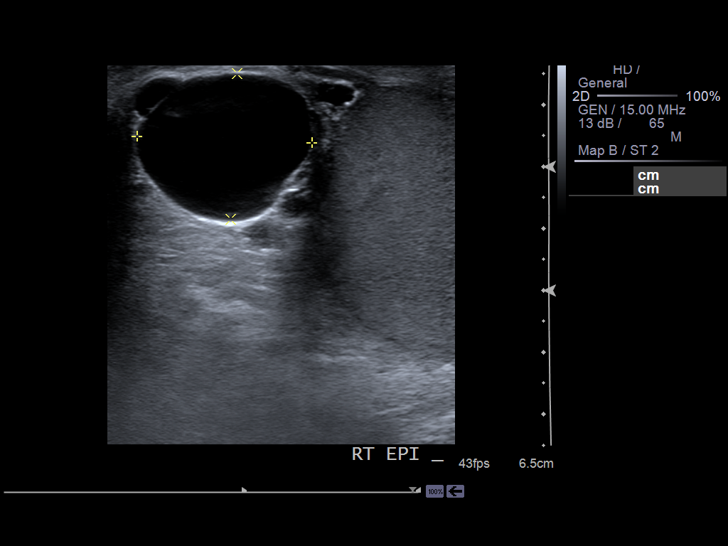
[im 56/67]
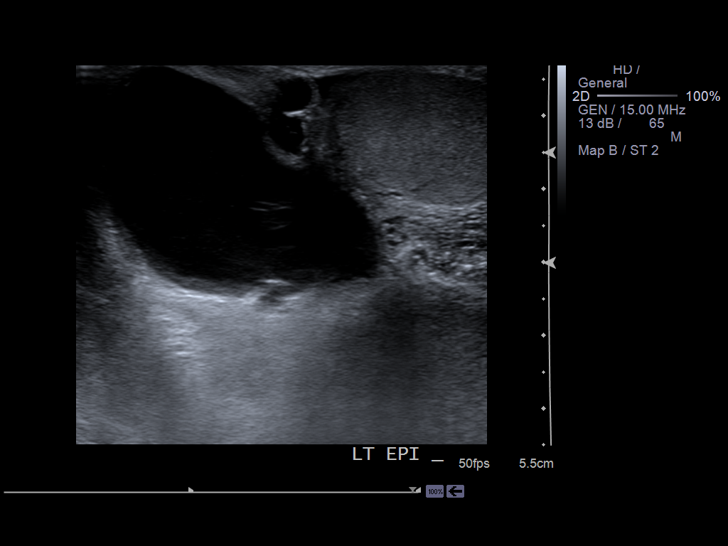
[im 61/67]
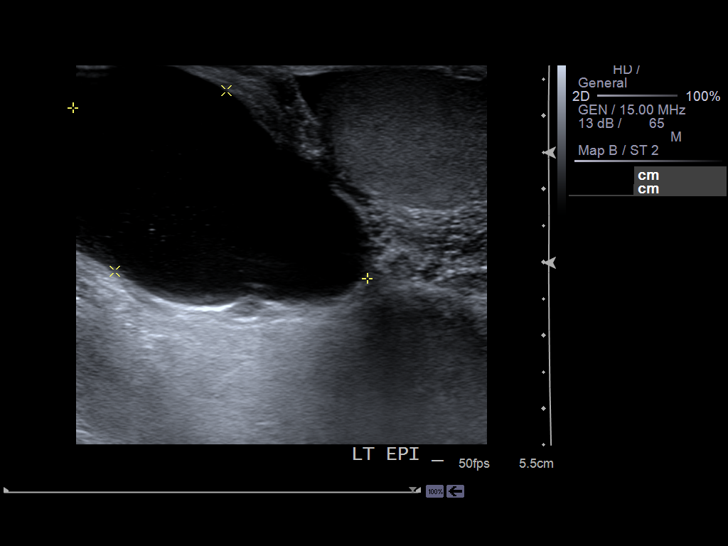
[im 67/67]
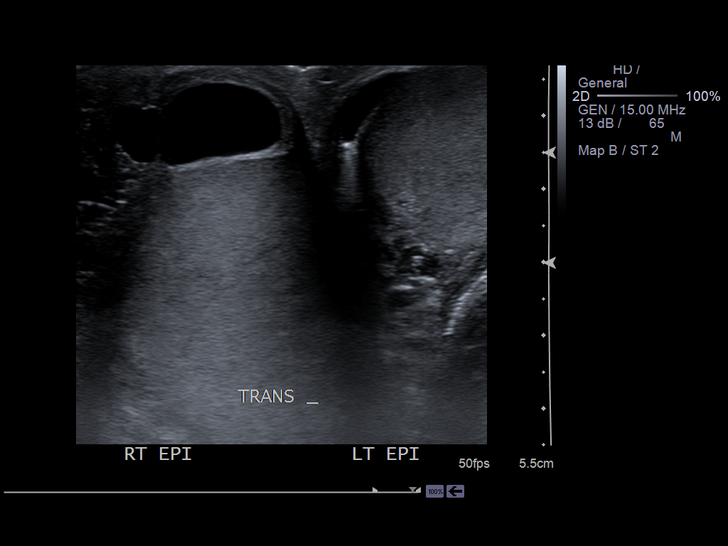

[14 of 25 positions shown; findings below may reference images not displayed]

PROCEDURE:     US  - US TESTICULAR  - October 01, 2012  [DATE]

RESULT:     Testicular sonogram shows the right testicle measures 4.72 x
3.54 x 2.58 cm. The left testicle measures 4.90 x 2.49 x 2.90 cm. The left
epididymis is measured at 2.27 x 2.32 x 2.39 cm. The right epididymis
measures 3.48 x 2.01 cm. The testicles show no solid or cystic mass or
evidence of calcification. Arterial and venous Doppler waveforms are
demonstrated bilaterally. Multiple epididymal cysts are present bilaterally.
The largest cyst in the right epididymis measures 2.83 x 2.36 x 3.91 cm. The
largest left epididymal cyst is 4.65 x 2.90 x 3.11 cm.
IMPRESSION: 1. No evidence of testicular torsion or mass. Multiple bilateral epididymal
cysts are present. No hydrocele or varicocele is evident.

[REDACTED]

## 2014-07-28 NOTE — Op Note (Signed)
PATIENT NAME:  Joseph Butler, Joseph Butler MR#:  [id number] DATE OF BIRTH:  June 06, 1955  DATE OF PROCEDURE:  12/03/2012  PREOPERATIVE DIAGNOSIS: Bilateral inguinal hernias.   POSTOPERATIVE DIAGNOSIS: Bilateral inguinal hernias, direct.   OPERATIVE PROCEDURE: Bilateral transabdominal preperitoneal hernia repair with 10 x 15 Physiomesh.   SURGEON: Donnalee CurryJeffrey Marithza Malachi, MD.   ANESTHESIA: General endotracheal.   ESTIMATED BLOOD LOSS: Minimal.   CLINICAL NOTE: This 59 year old male has developed a symptomatic right inguinal hernia. Clinical exam showed bilateral inguinal hernias. He is admitted for elective repair. He received Kefzol prior to the procedure. Hair was removed from the abdomen with clippers.   OPERATIVE NOTE: With the patient under adequate general endotracheal anesthesia, the abdomen was prepped with ChloraPrep and draped. A Veress needle was placed through a transumbilical incision and after assuring intra-abdominal location with the hanging drop test, the abdomen was insufflated with CO2 at 10 mmHg pressure. A 10 mm step port was expanded and inspection showed no evidence of injury from initial port placement. Two 5-mm ports were placed outside the rectus sheath bilaterally. The patient was placed in Trendelenburg position. Direct hernias were evident bilaterally. No evidence of indirect hernias. The peritoneum over the right groin was opened first with cautery dissection. The peritoneal flap was raised and the direct hernia sac reduced. A 10 x 15 Physiomesh was smoothed into position and making use of the secure strap tacks was anchored to the pubic tubercle and then along Cooper's ligament. A few tacks were placed superiorly on either side of the epigastric vessels. The peritoneum was tacked back into position and attention turned to the left side.   The left area was opened in a similar fashion and the preperitoneal space cleared. A 10 x 15 mesh was smoothed into position. This was again anchored to  the pubic tubercle and along the Cooper's ligament with the secure strap tacks. The superior aspect was anchored with a few tacks on either side of the inferior epigastric vessels. The peritoneum was closed and the abdomen then desufflated under direct vision. The fascia at the umbilicus was approximated with an 0 Vicryl figure-of-eight suture. Skin incisions were closed with 4-0 Vicryl subcuticular sutures. Benzoin, Steri-Strips, Telfa, and Tegaderm dressings were applied.   The patient tolerated the procedure well and was taken to the recovery room in stable condition.   ____________________________ Earline MayotteJeffrey W. Brandolyn Shortridge, MD jwb:dp Butler: 12/03/2012 12:52:10 ET T: 12/03/2012 13:26:18 ET JOB#: [id number]  cc: Earline MayotteJeffrey W. Hakan Nudelman, MD, <Dictator> Steele SizerMark A. Crissman, MD [place] Heather Brion AlimentW Dash Cardarelli MD ELECTRONICALLY SIGNED 12/03/2012 22:26

## 2015-04-03 ENCOUNTER — Emergency Department
Admission: EM | Admit: 2015-04-03 | Discharge: 2015-04-03 | Disposition: A | Discharge status: Home / Self Care | Payer: BLUE CROSS/BLUE SHIELD | Attending: Emergency Medicine | Admitting: Emergency Medicine

## 2015-04-03 ENCOUNTER — Emergency Department: Payer: BLUE CROSS/BLUE SHIELD

## 2015-04-03 DIAGNOSIS — Y9389 Activity, other specified: Secondary | ICD-10-CM | POA: Diagnosis not present

## 2015-04-03 DIAGNOSIS — S31811A Laceration without foreign body of right buttock, initial encounter: Secondary | ICD-10-CM | POA: Diagnosis not present

## 2015-04-03 DIAGNOSIS — Y9289 Other specified places as the place of occurrence of the external cause: Secondary | ICD-10-CM | POA: Insufficient documentation

## 2015-04-03 DIAGNOSIS — S40019A Contusion of unspecified shoulder, initial encounter: Secondary | ICD-10-CM

## 2015-04-03 DIAGNOSIS — Z23 Encounter for immunization: Secondary | ICD-10-CM | POA: Insufficient documentation

## 2015-04-03 DIAGNOSIS — W108XXA Fall (on) (from) other stairs and steps, initial encounter: Secondary | ICD-10-CM | POA: Insufficient documentation

## 2015-04-03 DIAGNOSIS — Z87891 Personal history of nicotine dependence: Secondary | ICD-10-CM | POA: Insufficient documentation

## 2015-04-03 DIAGNOSIS — Y998 Other external cause status: Secondary | ICD-10-CM | POA: Insufficient documentation

## 2015-04-03 DIAGNOSIS — Z79899 Other long term (current) drug therapy: Secondary | ICD-10-CM | POA: Diagnosis not present

## 2015-04-03 DIAGNOSIS — S40011A Contusion of right shoulder, initial encounter: Secondary | ICD-10-CM | POA: Insufficient documentation

## 2015-04-03 DIAGNOSIS — S4991XA Unspecified injury of right shoulder and upper arm, initial encounter: Secondary | ICD-10-CM | POA: Diagnosis present

## 2015-04-03 DIAGNOSIS — IMO0002 Reserved for concepts with insufficient information to code with codable children: Secondary | ICD-10-CM

## 2015-04-03 MED ORDER — TETANUS-DIPHTH-ACELL PERTUSSIS 5-2.5-18.5 LF-MCG/0.5 IM SUSP
0.5000 mL | Freq: Once | INTRAMUSCULAR | Status: AC
  Administered 2015-04-03: 0.5 mL via INTRAMUSCULAR
  Filled 2015-04-03: qty 0.5

## 2015-04-03 MED ORDER — LIDOCAINE-EPINEPHRINE (PF) 1 %-1:200000 IJ SOLN
INTRAMUSCULAR | Status: AC
  Administered 2015-04-03: 30 mL
  Filled 2015-04-03: qty 30

## 2015-04-03 NOTE — ED Notes (Signed)
Pt to triage via w/c with no distress noted; reports "missing a step" this morning and falling on an urn; c/o pain to right shoulder and laceration to right buttock

## 2015-04-03 NOTE — ED Provider Notes (Signed)
[facility] Emergency Department Provider Note  ____________________________________________  Time seen: Approximately 6:54 AM  I have reviewed the triage vital signs and the nursing notes.   HISTORY  Chief Complaint Fall; Shoulder Injury; and Laceration    HPI Joseph Butler is a 59 y.o. male patient reports she was coming down the steps and missed one or 2 fell and landed on a large [place]. He cut his buttocks there is about a 3 cm long laceration on the right buttocks. He also hit his right shoulder. He complains of pain in the deltoid area of the shoulder. He has no other injuries. He did not hit his head has no headache or neck pain. He did not pass out. He has no nausea vomiting blurry vision or any other complaints.   Past Medical History  Diagnosis Date  . Back pain   . Stenosis of cervical spine   . Stroke 2009    , Numbness Left  . Arthritis     Knees, Hands    Patient Active Problem List   Diagnosis Date Noted  . Bilateral inguinal hernia 11/10/2012  . Chest pain 09/10/2012    Past Surgical History  Procedure Laterality Date  . Anterior cervical decomp/discectomy fusion  05/08/2011    Procedure: ANTERIOR CERVICAL DECOMPRESSION/DISCECTOMY FUSION 1 LEVEL;  Surgeon: Dorian HeckleJoseph D Stern, MD;  Location: [facility];  Service: Neurosurgery;  Laterality: N/A;  Cervical six-seven anterior cervical decompression with fusion interbody prothesis plating and bonegraft  . Bone graft      x4  . Neck fusion      x2  . Hernia repair Bilateral 2014    bilateral inguinal, TAPP with Physiomesh.    Current Outpatient Rx  Name  Route  Sig  Dispense  Refill  . amitriptyline (ELAVIL) 25 MG tablet   Oral   Take 25 mg by mouth at bedtime.         . baclofen (LIORESAL) 10 MG tablet   Oral   Take 10 mg by mouth 4 (four) times daily.          . carvedilol (COREG) 6.25 MG tablet   Oral   Take 1 tablet by mouth 2 (two) times daily.         . Oxycodone  HCl 10 MG TABS      as needed.          Marland Kitchen. tiZANidine (ZANAFLEX) 4 MG tablet   Oral   Take 0.5 tablets by mouth as needed.           Allergies Review of patient's allergies indicates no known allergies.  Family History  Problem Relation Age of Onset  . Hypertension Mother   . Hypertension Father     Social History Social History  Substance Use Topics  . Smoking status: Former Smoker -- 1.00 packs/day for 30 years    Types: Cigarettes    Quit date: 04/08/2007  . Smokeless tobacco: Never Used  . Alcohol Use: 1.0 oz/week    2 drink(s) per week    Review of Systems Constitutional: No fever/chills Eyes: No visual changes. ENT: No sore throat. Cardiovascular: Denies chest pain. Respiratory: Denies shortness of breath. Gastrointestinal: No abdominal pain.  No nausea, no vomiting.  No diarrhea.  No constipation. Genitourinary: Negative for dysuria. Musculoskeletal: Negative for back pain. Patient does have pain in the right shoulder as described Skin: Negative for rash. Neurological: Negative for headaches, focal weakness or numbness.  10-point ROS otherwise negative.  ____________________________________________   PHYSICAL EXAM:  VITAL SIGNS: ED Triage Vitals  Enc Vitals Group     BP 04/03/15 0550 145/85 mmHg     Pulse Rate 04/03/15 0550 105     Resp 04/03/15 0550 18     Temp 04/03/15 0550 98 F (36.7 C)     Temp Source 04/03/15 0550 Oral     SpO2 04/03/15 0550 99 %     Weight 04/03/15 0550 200 lb (90.719 kg)     Height 04/03/15 0550 6' (1.829 m)     Head Cir --      Peak Flow --      Pain Score 04/03/15 0550 8     Pain Loc --      Pain Edu? --      Excl. in GC? --     Constitutional: Alert and oriented. Well appearing and in no acute distress. Eyes: Conjunctivae are normal. PERRL. EOMI. Head: Atraumatic. Nose: No congestion/rhinnorhea. Mouth/Throat: Mucous membranes are moist.  Oropharynx non-erythematous. Neck: No stridor. No cervical spine  tenderness to palpation. Cardiovascular: Normal rate, regular rhythm. Grossly normal heart sounds.  Good peripheral circulation. Respiratory: Normal respiratory effort.  No retractions. Lungs CTAB. Gastrointestinal: Soft and nontender. No distention. No abdominal bruits. No CVA tenderness. Musculoskeletal: No lower extremity tenderness nor edema.  No joint effusions. Patient does have pain just under the acromion laterally in the right shoulder. Range of motion is intact but the speed of it is little limited by pain. Normal distal pulses capillary refill and sensation. She also has the buttocks laceration as described in the history of present illness. Neurologic:  Normal speech and language. No gross focal neurologic deficits are appreciated. No gait instability. Skin:  Skin is warm, dry and intact. No rash noted. Psychiatric: Mood and affect are normal. Speech and behavior are normal.  ____________________________________________   LABS (all labs ordered are listed, but only abnormal results are displayed)  Labs Reviewed - No data to display ____________________________________________  EKG   ____________________________________________  RADIOLOGY  xray read by radiology as no fracture dislocation ____________________________________________   PROCEDURES  Procedure(s) performed laceration repair. Wound anesthetized with 1% lidocaine with epi and set was obtained skin clean with iodine wound irrigated with copious amounts of normal saline and probed no foreign bodies were seen or felt. Wound was reirrigated and then closed with 7 stitches of 3-0 nylon patient tolerated the procedure very well    ____________________________________________   INITIAL IMPRESSION / ASSESSMENT AND PLAN / ED COURSE  Pertinent labs & imaging results that were available during my care of the patient were reviewed by me and considered in my medical decision making (see chart for  details).   ____________________________________________   FINAL CLINICAL IMPRESSION(S) / ED DIAGNOSES  Final diagnoses:  Shoulder contusion, unspecified laterality, initial encounter  Laceration      Arnaldo Natal, MD 04/03/15 [id number]

## 2015-04-03 NOTE — ED Notes (Signed)
MD at bedside. 

## 2015-04-03 NOTE — ED Notes (Signed)
Patient transported to X-ray 

## 2015-04-03 NOTE — ED Notes (Signed)
Stiches site cleaned with warm water, covered with clean gauze per Dr Darnelle CatalanMalinda, pt tolerated well.

## 2015-04-03 NOTE — ED Notes (Addendum)
Pt had fall tonight at home , states he missed a couple of steps going downstairs and c/o right shoulder pain, pt also has approx 3inch lac on right buttock due to hitting ceramic ern. Pt denies any LOC

## 2015-04-04 ENCOUNTER — Encounter: Payer: Self-pay | Admitting: Family Medicine

## 2015-04-04 DIAGNOSIS — M4802 Spinal stenosis, cervical region: Secondary | ICD-10-CM | POA: Insufficient documentation

## 2015-04-04 DIAGNOSIS — I10 Essential (primary) hypertension: Secondary | ICD-10-CM | POA: Insufficient documentation

## 2015-04-04 DIAGNOSIS — E785 Hyperlipidemia, unspecified: Secondary | ICD-10-CM | POA: Insufficient documentation

## 2015-04-04 DIAGNOSIS — I639 Cerebral infarction, unspecified: Secondary | ICD-10-CM | POA: Insufficient documentation

## 2015-04-04 DIAGNOSIS — G9520 Unspecified cord compression: Secondary | ICD-10-CM | POA: Insufficient documentation

## 2015-04-04 DIAGNOSIS — M4804 Spinal stenosis, thoracic region: Secondary | ICD-10-CM | POA: Insufficient documentation

## 2015-04-05 ENCOUNTER — Encounter: Payer: Self-pay | Admitting: Physician Assistant

## 2015-04-05 ENCOUNTER — Ambulatory Visit (INDEPENDENT_AMBULATORY_CARE_PROVIDER_SITE_OTHER): Payer: BLUE CROSS/BLUE SHIELD | Admitting: Physician Assistant

## 2015-04-05 VITALS — BP 122/78 | HR 96 | Temp 98.3°F | Resp 18 | Wt 213.8 lb

## 2015-04-05 DIAGNOSIS — T148 Other injury of unspecified body region: Secondary | ICD-10-CM | POA: Diagnosis not present

## 2015-04-05 DIAGNOSIS — IMO0002 Reserved for concepts with insufficient information to code with codable children: Secondary | ICD-10-CM

## 2015-04-05 NOTE — Progress Notes (Signed)
Patient ID: Joseph Butler, male   DOB: 06-May-1955, 59 y.o.   MRN: [id number] Name: Joseph Butler   MRN: [id number]    DOB: April 12, 1955   Date:04/05/2015       Progress Note  Subjective  Chief Complaint  Chief Complaint  Patient presents with  . Follow-up    check sutures    HPI  [name] is a 59 year old male that comes to the office today for follow-up of a laceration on the right lower buttocks. He states that on Tuesday, 04/03/2015 he fell coming out of his house and fell onto a blue ceramic vase. The vase shattered and cut into his skin causing a 2-1/2 an inch laceration. He did go to the emergency room and had stitches placed. He comes today for evaluation of the wound since it is an area he cannot visually inspect often.  No problem-specific assessment & plan notes found for this encounter.   Past Medical History  Diagnosis Date  . Back pain   . Stenosis of cervical spine   . Stroke (HCC) 2009    , Numbness Left  . Arthritis     Knees, Hands    Social History  Substance Use Topics  . Smoking status: Former Smoker -- 1.00 packs/day for 30 years    Types: Cigarettes    Quit date: 04/08/2007  . Smokeless tobacco: Never Used  . Alcohol Use: 1.0 oz/week    2 Standard drinks or equivalent per week     Current outpatient prescriptions:  .  amitriptyline (ELAVIL) 25 MG tablet, Take 25 mg by mouth at bedtime., Disp: , Rfl:  .  baclofen (LIORESAL) 10 MG tablet, Take 10 mg by mouth 4 (four) times daily. , Disp: , Rfl:  .  niacin 500 MG CR capsule, Take by mouth., Disp: , Rfl:  .  Omega-3 Fatty Acids (FISH OIL BURP-LESS) 1200 MG CAPS, Take by mouth., Disp: , Rfl:  .  Oxycodone HCl 10 MG TABS, as needed. , Disp: , Rfl:  .  carvedilol (COREG) 6.25 MG tablet, Take 1 tablet by mouth 2 (two) times daily. Reported on 04/05/2015, Disp: , Rfl:  .  glucose blood test strip, Reported on 04/05/2015, Disp: , Rfl:  .  tiZANidine (ZANAFLEX) 4 MG tablet, Take 0.5 tablets by mouth as needed.  Reported on 04/05/2015, Disp: , Rfl:   No Known Allergies  Review of Systems  Constitutional: Negative for fever, chills and malaise/fatigue.  Respiratory: Negative.   Cardiovascular: Negative.   Gastrointestinal: Negative for nausea and vomiting.  Skin:       Laceration to right buttocks  Neurological: Negative for dizziness, focal weakness and headaches.    Objective  Filed Vitals:   04/05/15 0816  BP: 122/78  Pulse: 96  Temp: 98.3 F (36.8 C)  TempSrc: Oral  Resp: 18  Weight: 213 lb 12.8 oz (96.979 kg)  SpO2: 100%     Physical Exam  Constitutional: He is well-developed, well-nourished, and in no distress. No distress.  Cardiovascular: Normal rate, regular rhythm and normal heart sounds.  Exam reveals no gallop and no friction rub.   No murmur heard. Pulmonary/Chest: Effort normal and breath sounds normal. No respiratory distress. He has no wheezes. He has no rales.  Skin: He is not diaphoretic.     Vitals reviewed.   No results found for this or any previous visit (from the past 2160 hour(s)).   Assessment & Plan  1. Laceration The laceration on his right buttocks  is healing well. There are no signs of infection at this time. I did advise him to continue wound care as he has been directed to do so. I also advised him of signs and symptoms of infection to be watching out for and to call the office or go to the emergency room if it is after hours if any of these develop in the meantime. If he does well we will see him back on Tuesday for removal of the sutures.

## 2015-04-05 NOTE — Patient Instructions (Signed)
Sutured Wound Care °Sutures are stitches that can be used to close wounds. Taking care of your wound properly can help to prevent pain and infection. It can also help your wound to heal more quickly. °HOW TO CARE FOR YOUR SUTURED WOUND °Wound Care °· Keep the wound clean and dry. °· If you were given a bandage (dressing), you should change it at least once per day or as directed by your health care provider. You should also change it if it becomes wet or dirty. °· Keep the wound completely dry for the first 24 hours or as directed by your health care provider. After that time, you may shower or bathe. However, make sure that the wound is not soaked in water until the sutures have been removed. °· Clean the wound one time each day or as directed by your health care provider. °¨ Wash the wound with soap and water. °¨ Rinse the wound with water to remove all soap. °¨ Pat the wound dry with a clean towel. Do not rub the wound. °· After cleaning the wound, apply a thin layer of antibiotic ointment as directed by your health care provider. This will help to prevent infection and keep the dressing from sticking to the wound. °· Have the sutures removed as directed by your health care provider. °General Instructions °· Take or apply medicines only as directed by your health care provider. °· To help prevent scarring, make sure to cover your wound with sunscreen whenever you are outside after the sutures are removed and the wound is healed. Make sure to wear a sunscreen of at least 30 SPF. °· If you were prescribed an antibiotic medicine or ointment, finish all of it even if you start to feel better. °· Do not scratch or pick at the wound. °· Keep all follow-up visits as directed by your health care provider. This is important. °· Check your wound every day for signs of infection. Watch for:   °· Redness, swelling, or pain. °· Fluid, blood, or pus. °· Raise (elevate) the injured area above the level of your heart while you  are sitting or lying down, if possible. °· Avoid stretching your wound. °· Drink enough fluids to keep your urine clear or pale yellow. °SEEK MEDICAL CARE IF: °· You received a tetanus shot and you have swelling, severe pain, redness, or bleeding at the injection site. °· You have a fever. °· A wound that was closed breaks open. °· You notice a bad smell coming from the wound. °· You notice something coming out of the wound, such as wood or glass. °· Your pain is not controlled with medicine. °· You have increased redness, swelling, or pain at the site of your wound. °· You have fluid, blood, or pus coming from your wound. °· You notice a change in the color of your skin near your wound. °· You need to change the dressing frequently due to fluid, blood, or pus draining from the wound. °· You develop a new rash. °· You develop numbness around the wound. °SEEK IMMEDIATE MEDICAL CARE IF: °· You develop severe swelling around the injury site. °· Your pain suddenly increases and is severe. °· You develop painful lumps near the wound or on skin that is anywhere on your body. °· You have a red streak going away from your wound. °· The wound is on your hand or foot and you cannot properly move a finger or toe. °· The wound is on your hand or foot and   you notice that your fingers or toes look pale or bluish. °  °This information is not intended to replace advice given to you by your health care provider. Make sure you discuss any questions you have with your health care provider. °  °Document Released: 05/01/2004 Document Revised: 08/08/2014 Document Reviewed: 11/03/2012 °Elsevier Interactive Patient Education ©2016 [employer] ° °Laceration Care, Adult °A laceration is a cut that goes through all of the layers of the skin and into the tissue that is right under the skin. Some lacerations heal on their own. Others need to be closed with stitches (sutures), staples, skin adhesive strips, or skin glue. Proper laceration care  minimizes the risk of infection and helps the laceration to heal better. °HOW TO CARE FOR YOUR LACERATION °If sutures or staples were used: °· Keep the wound clean and dry. °· If you were given a bandage (dressing), you should change it at least one time per day or as told by your health care provider. You should also change it if it becomes wet or dirty. °· Keep the wound completely dry for the first 24 hours or as told by your health care provider. After that time, you may shower or bathe. However, make sure that the wound is not soaked in water until after the sutures or staples have been removed. °· Clean the wound one time each day or as told by your health care provider: °¨ Wash the wound with soap and water. °¨ Rinse the wound with water to remove all soap. °¨ Pat the wound dry with a clean towel. Do not rub the wound. °· After cleaning the wound, apply a thin layer of antibiotic ointment as told by your health care provider. This will help to prevent infection and keep the dressing from sticking to the wound. °· Have the sutures or staples removed as told by your health care provider. °If skin adhesive strips were used: °· Keep the wound clean and dry. °· If you were given a bandage (dressing), you should change it at least one time per day or as told by your health care provider. You should also change it if it becomes dirty or wet. °· Do not get the skin adhesive strips wet. You may shower or bathe, but be careful to keep the wound dry. °· If the wound gets wet, pat it dry with a clean towel. Do not rub the wound. °· Skin adhesive strips fall off on their own. You may trim the strips as the wound heals. Do not remove skin adhesive strips that are still stuck to the wound. They will fall off in time. °If skin glue was used: °· Try to keep the wound dry, but you may briefly wet it in the shower or bath. Do not soak the wound in water, such as by swimming. °· After you have showered or bathed, gently pat the  wound dry with a clean towel. Do not rub the wound. °· Do not do any activities that will make you sweat heavily until the skin glue has fallen off on its own. °· Do not apply liquid, cream, or ointment medicine to the wound while the skin glue is in place. Using those may loosen the film before the wound has healed. °· If you were given a bandage (dressing), you should change it at least one time per day or as told by your health care provider. You should also change it if it becomes dirty or wet. °· If a dressing   is placed over the wound, be careful not to apply tape directly over the skin glue. Doing that may cause the glue to be pulled off before the wound has healed. °· Do not pick at the glue. The skin glue usually remains in place for 5-10 days, then it falls off of the skin. °General Instructions °· Take over-the-counter and prescription medicines only as told by your health care provider. °· If you were prescribed an antibiotic medicine or ointment, take or apply it as told by your doctor. Do not stop using it even if your condition improves. °· To help prevent scarring, make sure to cover your wound with sunscreen whenever you are outside after stitches are removed, after adhesive strips are removed, or when glue remains in place and the wound is healed. Make sure to wear a sunscreen of at least 30 SPF. °· Do not scratch or pick at the wound. °· Keep all follow-up visits as told by your health care provider. This is important. °· Check your wound every day for signs of infection. Watch for: °¨ Redness, swelling, or pain. °¨ Fluid, blood, or pus. °· Raise (elevate) the injured area above the level of your heart while you are sitting or lying down, if possible. °SEEK MEDICAL CARE IF: °· You received a tetanus shot and you have swelling, severe pain, redness, or bleeding at the injection site. °· You have a fever. °· A wound that was closed breaks open. °· You notice a bad smell coming from your wound or your  dressing. °· You notice something coming out of the wound, such as wood or glass. °· Your pain is not controlled with medicine. °· You have increased redness, swelling, or pain at the site of your wound. °· You have fluid, blood, or pus coming from your wound. °· You notice a change in the color of your skin near your wound. °· You need to change the dressing frequently due to fluid, blood, or pus draining from the wound. °· You develop a new rash. °· You develop numbness around the wound. °SEEK IMMEDIATE MEDICAL CARE IF: °· You develop severe swelling around the wound. °· Your pain suddenly increases and is severe. °· You develop painful lumps near the wound or on skin that is anywhere on your body. °· You have a red streak going away from your wound. °· The wound is on your hand or foot and you cannot properly move a finger or toe. °· The wound is on your hand or foot and you notice that your fingers or toes look pale or bluish. °  °This information is not intended to replace advice given to you by your health care provider. Make sure you discuss any questions you have with your health care provider. °  °Document Released: 03/24/2005 Document Revised: 08/08/2014 Document Reviewed: 03/20/2014 °Elsevier Interactive Patient Education ©2016 [employer] ° °

## 2015-04-10 ENCOUNTER — Encounter: Payer: Self-pay | Admitting: Family Medicine

## 2015-04-10 ENCOUNTER — Ambulatory Visit (INDEPENDENT_AMBULATORY_CARE_PROVIDER_SITE_OTHER): Payer: BLUE CROSS/BLUE SHIELD | Admitting: Family Medicine

## 2015-04-10 VITALS — BP 128/82 | HR 92 | Temp 98.0°F | Resp 16 | Wt 216.8 lb

## 2015-04-10 DIAGNOSIS — S31811D Laceration without foreign body of right buttock, subsequent encounter: Secondary | ICD-10-CM | POA: Diagnosis not present

## 2015-04-10 NOTE — Progress Notes (Signed)
Patient ID: Joseph Butler, male   DOB: 12-01-1955, 60 y.o.   MRN: [id number]   Patient: Joseph Butler Male    DOB: 1955/08/02   60 y.o.   MRN: [id number] Visit Date: 04/10/2015  Today's Provider: Dortha Kern, PA   Chief Complaint  Patient presents with  . Suture / Staple Removal   Subjective:    Suture / Staple Removal The sutures were placed 7 to 10 days ago. He tried regular soap and water washings since the wound repair. There has been no drainage from the wound. There is no redness present. There is no swelling present. The pain has no pain. He has no difficulty moving the affected extremity or digit.   Patient Active Problem List   Diagnosis Date Noted  . Cerebral infarction (HCC) 04/04/2015  . Cervical spinal stenosis 04/04/2015  . Dyslipidemia 04/04/2015  . BP (high blood pressure) 04/04/2015  . Cervical spinal cord compression (HCC) 04/04/2015  . Spinal stenosis of thoracic region 04/04/2015  . Diabetes mellitus (HCC) 11/26/2012  . Bilateral inguinal hernia 11/10/2012  . Chest pain 09/10/2012   Past Surgical History  Procedure Laterality Date  . Anterior cervical decomp/discectomy fusion  05/08/2011    Procedure: ANTERIOR CERVICAL DECOMPRESSION/DISCECTOMY FUSION 1 LEVEL;  Surgeon: Dorian Heckle, MD;  Location: [facility];  Service: Neurosurgery;  Laterality: N/A;  Cervical six-seven anterior cervical decompression with fusion interbody prothesis plating and bonegraft  . Bone graft      x4  . Neck fusion      x2  . Hernia repair Bilateral 2014    bilateral inguinal, TAPP with Physiomesh.   Family History  Problem Relation Age of Onset  . Hypertension Mother   . Atrial fibrillation Mother   . Hypertension Father    No Known Allergies   Previous Medications   AMITRIPTYLINE (ELAVIL) 25 MG TABLET    Take 25 mg by mouth at bedtime.   BACLOFEN (LIORESAL) 10 MG TABLET    Take 10 mg by mouth 4 (four) times daily.    CARVEDILOL (COREG) 6.25 MG TABLET    Take 1 tablet by  mouth 2 (two) times daily. Reported on 04/05/2015   GLUCOSE BLOOD TEST STRIP    Reported on 04/05/2015   NIACIN 500 MG CR CAPSULE    Take by mouth.   OMEGA-3 FATTY ACIDS (FISH OIL BURP-LESS) 1200 MG CAPS    Take by mouth.   OXYCODONE HCL 10 MG TABS    as needed.    TIZANIDINE (ZANAFLEX) 4 MG TABLET    Take 0.5 tablets by mouth as needed. Reported on 04/05/2015    Review of Systems  Constitutional: Negative.   HENT: Negative.   Eyes: Negative.   Respiratory: Negative.   Cardiovascular: Negative.   Gastrointestinal: Negative.   Endocrine: Negative.   Genitourinary: Negative.   Musculoskeletal: Negative.   Skin: Negative.   Allergic/Immunologic: Negative.   Neurological: Negative.   Hematological: Negative.   Psychiatric/Behavioral: Negative.     Social History  Substance Use Topics  . Smoking status: Former Smoker -- 1.00 packs/day for 30 years    Types: Cigarettes    Quit date: 04/08/2007  . Smokeless tobacco: Never Used  . Alcohol Use: 1.0 oz/week    2 Standard drinks or equivalent per week   Objective:   BP 128/82 mmHg  Pulse 92  Temp(Src) 98 F (36.7 C) (Oral)  Resp 16  Wt 216 lb 12.8 oz (98.34 kg)  Physical Exam  Constitutional:  He is oriented to person, place, and time. He appears well-developed and well-nourished. No distress.  HENT:  Head: Normocephalic and atraumatic.  Right Ear: Hearing normal.  Left Ear: Hearing normal.  Nose: Nose normal.  Eyes: Conjunctivae and lids are normal. Right eye exhibits no discharge. Left eye exhibits no discharge. No scleral icterus.  Pulmonary/Chest: Effort normal. No respiratory distress.  Musculoskeletal: Normal range of motion.  Neurological: He is alert and oriented to person, place, and time.  Skin: Skin is intact. No lesion and no rash noted.     Psychiatric: He has a normal mood and affect. His speech is normal and behavior is normal. Thought content normal.      Assessment & Plan:     1. Laceration of right  buttock, subsequent encounter Fell at home and lacerated right buttock on a broken vase on 04-03-15. Well healed without signs of infection. Sutures removed. Tdap was given in the ER on 04-03-15. Recheck prn.

## 2015-06-11 ENCOUNTER — Telehealth: Payer: Self-pay

## 2015-06-11 NOTE — Telephone Encounter (Signed)
Victorino DikeJennifer called back. She will fix the error .

## 2015-06-11 NOTE — Telephone Encounter (Signed)
LFt vm for Victorino DikeJennifer at [facility] that patient has no office visit in the last 4 years with Dr.Sethi. A  [place] report from [facility] was fax.to Dr.Sethi.

## 2015-11-02 ENCOUNTER — Ambulatory Visit (INDEPENDENT_AMBULATORY_CARE_PROVIDER_SITE_OTHER): Payer: BLUE CROSS/BLUE SHIELD | Admitting: Family Medicine

## 2015-11-02 ENCOUNTER — Encounter: Payer: Self-pay | Admitting: Family Medicine

## 2015-11-02 VITALS — BP 130/82 | HR 109 | Temp 98.8°F | Resp 16 | Wt 207.0 lb

## 2015-11-02 DIAGNOSIS — M4802 Spinal stenosis, cervical region: Secondary | ICD-10-CM

## 2015-11-02 DIAGNOSIS — M4804 Spinal stenosis, thoracic region: Secondary | ICD-10-CM

## 2015-11-02 DIAGNOSIS — Z418 Encounter for other procedures for purposes other than remedying health state: Secondary | ICD-10-CM | POA: Diagnosis not present

## 2015-11-02 DIAGNOSIS — Z2989 Encounter for other specified prophylactic measures: Secondary | ICD-10-CM

## 2015-11-02 DIAGNOSIS — Z298 Encounter for other specified prophylactic measures: Secondary | ICD-10-CM

## 2015-11-02 MED ORDER — AMOXICILLIN 500 MG PO CAPS: ORAL_CAPSULE | ORAL | 0 refills | Status: DC

## 2015-11-02 NOTE — Progress Notes (Signed)
Patient: Joseph Butler Male    DOB: 1956-02-13   60 y.o.   MRN: [id number] Visit Date: 11/02/2015  Today's Provider: Dortha Kern, PA   Chief Complaint  Patient presents with  . Spinal Stenosis    Cervical   Subjective:    HPI Cervical Spinal stenosis:  Patient comes in today requesting an prophylactic antibiotic to take before his upcomming Dental work procedure.  Past Medical History:  Diagnosis Date  . Arthritis    Knees, Hands  . Back pain   . Stenosis of cervical spine   . Stroke [facility]) 2009   , Numbness Left   Past Surgical History:  Procedure Laterality Date  . ANTERIOR CERVICAL DECOMP/DISCECTOMY FUSION  05/08/2011   Procedure: ANTERIOR CERVICAL DECOMPRESSION/DISCECTOMY FUSION 1 LEVEL;  Surgeon: Dorian Heckle, MD;  Location: [facility];  Service: Neurosurgery;  Laterality: N/A;  Cervical six-seven anterior cervical decompression with fusion interbody prothesis plating and bonegraft  . bone graft     x4  . HERNIA REPAIR Bilateral 2014   bilateral inguinal, TAPP with Physiomesh.  . neck fusion     x2   Family History  Problem Relation Age of Onset  . Hypertension Mother   . Atrial fibrillation Mother   . Hypertension Father    No Known Allergies   Current Meds  Medication Sig  . amitriptyline (ELAVIL) 50 MG tablet Take 50 mg by mouth daily.  . baclofen (LIORESAL) 10 MG tablet Take 10 mg by mouth 4 (four) times daily.   . carvedilol (COREG) 6.25 MG tablet Take 1 tablet by mouth 2 (two) times daily. Reported on 04/05/2015  . glucose blood test strip Reported on 04/05/2015  . niacin 500 MG CR capsule Take by mouth.  . Omega-3 Fatty Acids (FISH OIL BURP-LESS) 1200 MG CAPS Take by mouth.  . Oxycodone HCl 10 MG TABS as needed.     Review of Systems  Constitutional: Negative for appetite change, chills and fever.  Respiratory: Negative for chest tightness, shortness of breath and wheezing.   Cardiovascular: Negative for chest pain and palpitations.    Gastrointestinal: Negative for abdominal pain, nausea and vomiting.  Musculoskeletal: Positive for neck pain and neck stiffness.    Social History  Substance Use Topics  . Smoking status: Former Smoker    Packs/day: 1.00    Years: 30.00    Types: Cigarettes    Quit date: 04/08/2007  . Smokeless tobacco: Never Used  . Alcohol use 1.0 oz/week    2 Standard drinks or equivalent per week   Objective:   BP 130/82 (BP Location: Right Arm, Patient Position: Sitting, Cuff Size: Large)   Pulse (!) 109   Temp 98.8 F (37.1 C) (Oral)   Resp 16   Wt 207 lb (93.9 kg)   SpO2 95% Comment: room air  BMI 28.07 kg/m   Physical Exam  Constitutional: He is oriented to person, place, and time. He appears well-developed and well-nourished.  HENT:  Head: Normocephalic.  Eyes: Conjunctivae and EOM are normal.  Neck:  Very limited ROM due to fusion of C-spine.  Cardiovascular: Normal rate and regular rhythm.   Pulmonary/Chest: Breath sounds normal.  Abdominal: Soft. Bowel sounds are normal.  Neurological: He is alert and oriented to person, place, and time.  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      Assessment & Plan:     1. SBE (  subacute bacterial endocarditis) prophylaxis candidate Needs refill of antibiotic in preparation for dental work. - amoxicillin (AMOXIL) 500 MG capsule; Four tablets by mouth 1 hour prior to dental procedures.  Dispense: 30 capsule; Refill: 0  2. Spinal stenosis of thoracic region Still has some diminished strength and balance due to radicular pain. Uses cane for balance. Slow stiff gait. Completed for for handicap placard. Continue to follow up at the pain management clinic. Can't walk far without stopping to rest.  3. Cervical spinal stenosis History of cervical fusion with screws in 2013. Uses antibiotic prior to dental procedures to prevent endocarditis. Has chronic neck and back pain        Dortha Kern, PA  [facility]

## 2016-04-18 ENCOUNTER — Telehealth: Payer: Self-pay

## 2016-04-18 NOTE — Telephone Encounter (Signed)
Left vm for Joseph PoloJennifer James at [phone]. Rn left vm that pt [facility] in the last 5 years. Rn stated there is no referral in the system from the ED or MD. Also there is chart conversion note from 2009. Rn stated the office notes need to be sent to PCP. Rn requested a call back.

## 2016-05-27 ENCOUNTER — Encounter: Payer: Self-pay | Admitting: Family Medicine

## 2016-05-27 ENCOUNTER — Ambulatory Visit (INDEPENDENT_AMBULATORY_CARE_PROVIDER_SITE_OTHER): Payer: BLUE CROSS/BLUE SHIELD | Admitting: Family Medicine

## 2016-05-27 VITALS — BP 132/90 | HR 108 | Temp 98.4°F | Resp 18 | Wt 198.8 lb

## 2016-05-27 DIAGNOSIS — J320 Chronic maxillary sinusitis: Secondary | ICD-10-CM | POA: Diagnosis not present

## 2016-05-27 DIAGNOSIS — I10 Essential (primary) hypertension: Secondary | ICD-10-CM

## 2016-05-27 DIAGNOSIS — K029 Dental caries, unspecified: Secondary | ICD-10-CM

## 2016-05-27 MED ORDER — AMOXICILLIN 875 MG PO TABS: 875.0000 mg | ORAL_TABLET | Freq: Two times a day (BID) | ORAL | 0 refills | Status: DC

## 2016-05-27 MED ORDER — CARVEDILOL 6.25 MG PO TABS: 6.2500 mg | ORAL_TABLET | Freq: Two times a day (BID) | ORAL | 3 refills | Status: DC

## 2016-05-27 NOTE — Progress Notes (Signed)
Patient: Joseph SacksJohn D Butler Male    DOB: 11/24/1955   61 y.o.   MRN: [id number] Visit Date: 05/27/2016  Today's Provider: Dortha Kernennis Chrismon, PA   Chief Complaint  Patient presents with  . Sinusitis   Subjective:    Sinusitis  This is a new problem. Episode onset: Thursday. The problem is unchanged. Associated symptoms include ear pain and sinus pressure. (Facial pain, and  tooth pain) Treatments tried: Mucinex and Sudafed. The treatment provided mild relief.   Past Medical History:  Diagnosis Date  . Arthritis    Knees, Hands  . Back pain   . Stenosis of cervical spine   . Stroke [facility](HCC) 2009   , Numbness Left   Past Surgical History:  Procedure Laterality Date  . ANTERIOR CERVICAL DECOMP/DISCECTOMY FUSION  05/08/2011   Procedure: ANTERIOR CERVICAL DECOMPRESSION/DISCECTOMY FUSION 1 LEVEL;  Surgeon: Dorian HeckleJoseph D Stern, MD;  Location: [facility];  Service: Neurosurgery;  Laterality: N/A;  Cervical six-seven anterior cervical decompression with fusion interbody prothesis plating and bonegraft  . bone graft     x4  . HERNIA REPAIR Bilateral 2014   bilateral inguinal, TAPP with Physiomesh.  . neck fusion     x2   Family History  Problem Relation Age of Onset  . Hypertension Mother   . Atrial fibrillation Mother   . Hypertension Father    No Known Allergies   Previous Medications   AMITRIPTYLINE (ELAVIL) 50 MG TABLET    Take 50 mg by mouth daily.   BACLOFEN (LIORESAL) 10 MG TABLET    Take 10 mg by mouth 4 (four) times daily.    CARVEDILOL (COREG) 6.25 MG TABLET    Take 1 tablet by mouth 2 (two) times daily. Reported on 04/05/2015   GLUCOSE BLOOD TEST STRIP    Reported on 04/05/2015   NIACIN 500 MG CR CAPSULE    Take by mouth.   OMEGA-3 FATTY ACIDS (FISH OIL BURP-LESS) 1200 MG CAPS    Take by mouth.   OXYCODONE HCL 10 MG TABS    as needed.     Review of Systems  Constitutional: Negative.   HENT: Positive for dental problem, ear pain, sinus pain and sinus pressure.   Respiratory:  Negative.   Cardiovascular: Negative.     Social History  Substance Use Topics  . Smoking status: Former Smoker    Packs/day: 1.00    Years: 30.00    Types: Cigarettes    Quit date: 04/08/2007  . Smokeless tobacco: Never Used  . Alcohol use 1.0 oz/week    2 Standard drinks or equivalent per week   Objective:   BP 132/90 (BP Location: Right Arm, Patient Position: Sitting, Cuff Size: Normal)   Pulse (!) 108   Temp 98.4 F (36.9 C) (Oral)   Resp 18   Wt 198 lb 12.8 oz (90.2 kg)   SpO2 98%   BMI 26.96 kg/m  BP Readings from Last 3 Encounters:  05/27/16 132/90  11/02/15 130/82  04/10/15 128/82    Physical Exam  Constitutional: He is oriented to person, place, and time. He appears well-developed and well-nourished. No distress.  HENT:  Head: Normocephalic and atraumatic.  Right Ear: Hearing and external ear normal.  Left Ear: Hearing and external ear normal.  Nose: Nose normal.  Tender right maxillary, ethmoid and frontal sinus area. No transillumination of the maxillary sinus. Right upper molar broken off at the gum and blackened without tenderness to tap.  Eyes: Conjunctivae and lids are normal.  Right eye exhibits no discharge. Left eye exhibits no discharge. No scleral icterus.  Neck: Neck supple.  Cardiovascular: Normal rate and regular rhythm.   Pulmonary/Chest: Effort normal and breath sounds normal. No respiratory distress.  Abdominal: Soft.  Lymphadenopathy:    He has no cervical adenopathy.  Neurological: He is alert and oriented to person, place, and time.  Skin: Skin is intact. No lesion and no rash noted.  Psychiatric: He has a normal mood and affect. His speech is normal and behavior is normal. Thought content normal.      Assessment & Plan:     1. Right maxillary sinusitis Onset with swelling and pain of the right frontal and maxillary sinuses. Large caries of the right upper molar may be come abscess formation. Will treat with Mucinex, Flonase and  antibiotic. Recheck prn. - amoxicillin (AMOXIL) 875 MG tablet; Take 1 tablet (875 mg total) by mouth 2 (two) times daily.  Dispense: 20 tablet; Refill: 0  2. Dental caries Has plans for dental work for molar broken off at the gumline on the right upper jaw. Minimal puffiness of the right cheek. No pain to tap on tooth root. Will treat sinuses and tooth with antibiotic.  3. Essential hypertension Borderline BP today. Has been off the Coreg for a few months. Wants to restart it and plan physical with BP follow up in the Fall. May start with Coreg 6.25 mg qd then increase to BID if BP no controlled. He will check BP at home regularly and call report to the office if having any problem or persistent elevation. - carvedilol (COREG) 6.25 MG tablet; Take 1 tablet (6.25 mg total) by mouth 2 (two) times daily.  Dispense: 60 tablet; Refill: 3

## 2017-02-11 ENCOUNTER — Other Ambulatory Visit: Payer: Self-pay | Admitting: Family Medicine

## 2017-02-11 DIAGNOSIS — I10 Essential (primary) hypertension: Secondary | ICD-10-CM

## 2017-09-05 ENCOUNTER — Emergency Department
Admission: EM | Admit: 2017-09-05 | Discharge: 2017-09-05 | Disposition: A | Discharge status: Home / Self Care | Payer: BLUE CROSS/BLUE SHIELD | Attending: Emergency Medicine | Admitting: Emergency Medicine

## 2017-09-05 ENCOUNTER — Other Ambulatory Visit: Payer: Self-pay

## 2017-09-05 ENCOUNTER — Encounter: Payer: Self-pay | Admitting: Emergency Medicine

## 2017-09-05 DIAGNOSIS — W228XXA Striking against or struck by other objects, initial encounter: Secondary | ICD-10-CM | POA: Diagnosis not present

## 2017-09-05 DIAGNOSIS — Z79899 Other long term (current) drug therapy: Secondary | ICD-10-CM | POA: Diagnosis not present

## 2017-09-05 DIAGNOSIS — S0993XA Unspecified injury of face, initial encounter: Secondary | ICD-10-CM | POA: Diagnosis present

## 2017-09-05 DIAGNOSIS — Y9301 Activity, walking, marching and hiking: Secondary | ICD-10-CM | POA: Diagnosis not present

## 2017-09-05 DIAGNOSIS — Y929 Unspecified place or not applicable: Secondary | ICD-10-CM | POA: Diagnosis not present

## 2017-09-05 DIAGNOSIS — Y999 Unspecified external cause status: Secondary | ICD-10-CM | POA: Diagnosis not present

## 2017-09-05 DIAGNOSIS — E119 Type 2 diabetes mellitus without complications: Secondary | ICD-10-CM | POA: Diagnosis not present

## 2017-09-05 DIAGNOSIS — S0181XA Laceration without foreign body of other part of head, initial encounter: Secondary | ICD-10-CM | POA: Diagnosis not present

## 2017-09-05 DIAGNOSIS — F17200 Nicotine dependence, unspecified, uncomplicated: Secondary | ICD-10-CM | POA: Diagnosis not present

## 2017-09-05 MED ORDER — LIDOCAINE-EPINEPHRINE-TETRACAINE (LET) SOLUTION
15.0000 mL | Freq: Once | NASAL | Status: AC
  Administered 2017-09-05: 15 mL via TOPICAL
  Filled 2017-09-05: qty 15

## 2017-09-05 NOTE — ED Triage Notes (Signed)
Pt says he tripped going up his steps and fell on the last step; hit top of his head on a door jamb; linear laceration present; denies loss of consciousness; pt talking in complete coherent sentences

## 2017-09-05 NOTE — ED Provider Notes (Signed)
[facility] Emergency Department Provider Note  ____________________________________________  Time seen: Approximately 10:53 PM  I have reviewed the triage vital signs and the nursing notes.   HISTORY  Chief Complaint Laceration    HPI Joseph Butler is a 62 y.o. male that presents to the emergency department for evaluation of forehead laceration.  Patient was going up the stairs when he hit his head on the door frame.  Tetanus shot is up-to-date.  He is not on any blood thinners.  He did not lose consciousness.  Past Medical History:  Diagnosis Date  . Arthritis    Knees, Hands  . Back pain   . Stenosis of cervical spine   . Stroke [facility](HCC) 2009   , Numbness Left    Patient Active Problem List   Diagnosis Date Noted  . Cerebral infarction (HCC) 04/04/2015  . Cervical spinal stenosis 04/04/2015  . Dyslipidemia 04/04/2015  . BP (high blood pressure) 04/04/2015  . Cervical spinal cord compression (HCC) 04/04/2015  . Spinal stenosis of thoracic region 04/04/2015  . Diabetes mellitus (HCC) 11/26/2012  . Bilateral inguinal hernia 11/10/2012  . Chest pain 09/10/2012    Past Surgical History:  Procedure Laterality Date  . ANTERIOR CERVICAL DECOMP/DISCECTOMY FUSION  05/08/2011   Procedure: ANTERIOR CERVICAL DECOMPRESSION/DISCECTOMY FUSION 1 LEVEL;  Surgeon: Dorian HeckleJoseph D Stern, MD;  Location: [facility];  Service: Neurosurgery;  Laterality: N/A;  Cervical six-seven anterior cervical decompression with fusion interbody prothesis plating and bonegraft  . bone graft     x4  . HERNIA REPAIR Bilateral 2014   bilateral inguinal, TAPP with Physiomesh.  . neck fusion     x2    Prior to Admission medications   Medication Sig Start Date End Date Taking? Authorizing Provider  baclofen (LIORESAL) 10 MG tablet Take 10 mg by mouth 4 (four) times daily.  09/01/12  Yes [provider]  carvedilol (COREG) 6.25 MG tablet TAKE 1 TABLET BY MOUTH TWICE (2) DAILY  02/12/17  Yes Chrismon, Jodell Ciproennis E, PA  glucose blood test strip Reported on 04/05/2015 01/03/13  Yes [provider]  niacin 500 MG CR capsule Take by mouth.   Yes [provider]  Omega-3 Fatty Acids (FISH OIL BURP-LESS) 1200 MG CAPS Take by mouth.   Yes [provider]  Oxycodone HCl 10 MG TABS as needed.  08/27/12  Yes [provider]  amitriptyline (ELAVIL) 50 MG tablet Take 50 mg by mouth 4 (four) times daily.     [provider]    Allergies Patient has no known allergies.  Family History  Problem Relation Age of Onset  . Hypertension Mother   . Atrial fibrillation Mother   . Hypertension Father     Social History Social History   Tobacco Use  . Smoking status: Former Smoker    Packs/day: 1.00    Years: 30.00    Pack years: 30.00    Types: Cigarettes    Last attempt to quit: 04/08/2007    Years since quitting: 10.4  . Smokeless tobacco: Never Used  Substance Use Topics  . Alcohol use: Yes    Alcohol/week: 1.0 oz    Types: 2 Standard drinks or equivalent per week  . Drug use: No     Review of Systems  Cardiovascular: No chest pain. Respiratory: No SOB. Gastrointestinal: No nausea, no vomiting.  Musculoskeletal: Negative for musculoskeletal pain. Skin: Negative for rash, ecchymosis. Positive for laceration  Neurological: Negative for headaches, numbness or tingling  ____________________________________________   PHYSICAL EXAM:  VITAL SIGNS: ED Triage Vitals  Enc Vitals Group     BP 09/05/17 2216 (!) 150/75     Pulse Rate 09/05/17 2216 98     Resp 09/05/17 2216 18     Temp 09/05/17 2216 97.7 F (36.5 C)     Temp Source 09/05/17 2216 Oral     SpO2 09/05/17 2216 100 %     Weight 09/05/17 2217 187 lb (84.8 kg)     Height 09/05/17 2217 6' (1.829 m)     Head Circumference --      Peak Flow --      Pain Score 09/05/17 2217 7     Pain Loc --      Pain Edu? --      Excl. in GC? --      Constitutional: Alert  and oriented. Well appearing and in no acute distress. Eyes: Conjunctivae are normal. PERRL. EOMI. Head: 3cm midline laceration to scalp extending from top of head to forehead ENT:      Ears:      Nose: No congestion/rhinnorhea.      Mouth/Throat: Mucous membranes are moist.  Neck: No stridor. Cardiovascular: Normal rate, regular rhythm.  Good peripheral circulation. Respiratory: Normal respiratory effort without tachypnea or retractions. Lungs CTAB. Good air entry to the bases with no decreased or absent breath sounds. Musculoskeletal: Full range of motion to all extremities. No gross deformities appreciated. Neurologic:  Normal speech and language. No gross focal neurologic deficits are appreciated.  Skin:  Skin is warm, dry. No rash noted.    ____________________________________________   LABS (all labs ordered are listed, but only abnormal results are displayed)  Labs Reviewed - No data to display ____________________________________________  EKG   ____________________________________________  RADIOLOGY   No results found.  ____________________________________________    PROCEDURES  Procedure(s) performed:    Procedures  LACERATION REPAIR Performed by: Enid Derry  Consent: Verbal consent obtained.  Consent given by: patient  Prepped and Draped in normal sterile fashion  Wound explored: No foreign bodies   Laceration Location: scalp  Laceration Length: 3 cm  Anesthesia: None  Local anesthetic: LET  Irrigation method: syringe  Amount of cleaning: normal saline  Skin closure: staples  Number of staples: 8  Patient tolerance: Patient tolerated the procedure well with no immediate complications.  Medications  lidocaine-EPINEPHrine-tetracaine (LET) solution (15 mLs Topical Given by Other 09/05/17 2339)     ____________________________________________   INITIAL IMPRESSION / ASSESSMENT AND PLAN / ED COURSE  Pertinent labs &  imaging results that were available during my care of the patient were reviewed by me and considered in my medical decision making (see chart for details).  Review of the [place] CSRS was performed in accordance of the [facility] prior to dispensing any controlled drugs.   Patient's diagnosis is consistent with scalp laceration.  Vital signs and exam are reassuring.  Laceration was repaired with staples.  Patient is to follow up with PCP as directed. Patient is given ED precautions to return to the ED for any worsening or new symptoms.     ____________________________________________  FINAL CLINICAL IMPRESSION(S) / ED DIAGNOSES  Final diagnoses:  Laceration of forehead, initial encounter      NEW MEDICATIONS STARTED DURING THIS VISIT:  ED Discharge Orders    None          This chart was dictated using voice recognition software/Dragon. Despite best efforts to proofread, errors can occur which can change  the meaning. Any change was purely unintentional.    Enid Derry, PA-C 09/05/17 2343    Minna Antis, MD 09/06/17 [phone]

## 2017-09-05 NOTE — ED Notes (Signed)
Pt has laceration noted to top of head.

## 2017-09-15 ENCOUNTER — Encounter: Payer: Self-pay | Admitting: Family Medicine

## 2017-09-15 ENCOUNTER — Ambulatory Visit (INDEPENDENT_AMBULATORY_CARE_PROVIDER_SITE_OTHER): Payer: BLUE CROSS/BLUE SHIELD | Admitting: Family Medicine

## 2017-09-15 VITALS — BP 130/82 | HR 100 | Temp 98.2°F | Ht 71.0 in | Wt 185.8 lb

## 2017-09-15 DIAGNOSIS — S0101XD Laceration without foreign body of scalp, subsequent encounter: Secondary | ICD-10-CM | POA: Diagnosis not present

## 2017-09-15 DIAGNOSIS — Z4802 Encounter for removal of sutures: Secondary | ICD-10-CM

## 2017-09-15 NOTE — Progress Notes (Signed)
Patient: Joseph Butler Male    DOB: 07/22/1955   62 y.o.   MRN: [id number] Visit Date: 09/15/2017  Today's Provider: Dortha Kernennis Chrismon, PA   Chief Complaint  Patient presents with  . Suture / Staple Removal   Subjective:    Suture / Staple Removal  [facility]. 8 sutures placed after a fall  He tried nothing since the wound repair. His temperature was unmeasured prior to arrival. There has been no drainage from the wound. There is no redness present. There is no swelling present. The pain has improved. He has no difficulty moving the affected extremity or digit.   Past Medical History:  Diagnosis Date  . Arthritis    Knees, Hands  . Back pain   . Stenosis of cervical spine   . Stroke [facility](HCC) 2009   , Numbness Left   Past Surgical History:  Procedure Laterality Date  . ANTERIOR CERVICAL DECOMP/DISCECTOMY FUSION  05/08/2011   Procedure: ANTERIOR CERVICAL DECOMPRESSION/DISCECTOMY FUSION 1 LEVEL;  Surgeon: Dorian HeckleJoseph D Stern, MD;  Location: [facility];  Service: Neurosurgery;  Laterality: N/A;  Cervical six-seven anterior cervical decompression with fusion interbody prothesis plating and bonegraft  . bone graft     x4  . HERNIA REPAIR Bilateral 2014   bilateral inguinal, TAPP with Physiomesh.  . neck fusion     x2   Family History  Problem Relation Age of Onset  . Hypertension Mother   . Atrial fibrillation Mother   . Hypertension Father    No Known Allergies  Current Outpatient Medications:  .  amitriptyline (ELAVIL) 50 MG tablet, Take 50 mg by mouth 4 (four) times daily. , Disp: , Rfl:  .  baclofen (LIORESAL) 10 MG tablet, Take 10 mg by mouth 4 (four) times daily. , Disp: , Rfl:  .  carvedilol (COREG) 6.25 MG tablet, TAKE 1 TABLET BY MOUTH TWICE (2) DAILY, Disp: 60 tablet, Rfl: 3 .  niacin 500 MG CR capsule, Take by mouth., Disp: , Rfl:  .  Omega-3 Fatty Acids (FISH OIL BURP-LESS) 1200 MG CAPS, Take by mouth., Disp: , Rfl:  .  Oxycodone HCl 10 MG TABS,  as needed. , Disp: , Rfl:  .  glucose blood test strip, Reported on 04/05/2015, Disp: , Rfl:   Review of Systems  Constitutional: Negative.   Respiratory: Negative.   Cardiovascular: Negative.   Skin:       Forehead laceration    Social History   Tobacco Use  . Smoking status: Former Smoker    Packs/day: 1.00    Years: 30.00    Pack years: 30.00    Types: Cigarettes    Last attempt to quit: 04/08/2007    Years since quitting: 10.4  . Smokeless tobacco: Never Used  Substance Use Topics  . Alcohol use: Yes    Comment: occasionally    Objective:   BP 130/82 (BP Location: Right Arm, Patient Position: Sitting, Cuff Size: Normal)   Pulse 100   Temp 98.2 F (36.8 C) (Oral)   Ht 5\' 11"  (1.803 m)   Wt 185 lb 12.8 oz (84.3 kg)   SpO2 99%   BMI 25.91 kg/m  Vitals:   09/15/17 1531  BP: 130/82  Pulse: 100  Temp: 98.2 F (36.8 C)  TempSrc: Oral  SpO2: 99%  Weight: 185 lb 12.8 oz (84.3 kg)  Height: 5\' 11"  (1.803 m)   Physical Exam  Constitutional: He is oriented to person, place,  and time. He appears well-developed and well-nourished. No distress.  HENT:  Head: Normocephalic and atraumatic.  Right Ear: Hearing normal.  Left Ear: Hearing normal.  Nose: Nose normal.  Eyes: Conjunctivae and lids are normal. Right eye exhibits no discharge. Left eye exhibits no discharge. No scleral icterus.  Neck:  Very limited ROM due to fusion of C-spine.   Pulmonary/Chest: Effort normal. No respiratory distress.  Neurological: He is alert and oriented to person, place, and time.  Skin: Skin is intact. No lesion and no rash noted.  Sagittal 4 cm laceration starting at the hairline above the forehead. Well healed with 8 staples in place.  Psychiatric: He has a normal mood and affect. His speech is normal and behavior is normal. Thought content normal.   Depression screen PHQ 2/9 09/15/2017  Decreased Interest 2  Down, Depressed, Hopeless 1  PHQ - 2 Score 3  Altered sleeping 1  Tired,  decreased energy 2  Change in appetite 2  Feeling bad or failure about yourself  1  Trouble concentrating 0  Moving slowly or fidgety/restless 1  Suicidal thoughts 0  PHQ-9 Score 10  Difficult doing work/chores Not difficult at all       Assessment & Plan:     1. Laceration of scalp, subsequent encounter Larey Seat against a door frame at home on 09-05-17 causing a 4 cm laceration. Last tetanus booster was on 04-03-15. Healing well without neurovascular deficit associated with laceration. No LOC at time of trauma. Has some spasticity to gait from past stroke - unchanged.  2. Visit for suture removal 8 staples removed from sagittal laceration at the midline of scalp at the hairline above the forehead. No sign of infection and no drainage. Recheck prn. Patient plans to schedule follow up appointment for screening tests with CPE.      Dortha Kern, PA  [facility]

## 2018-03-19 ENCOUNTER — Other Ambulatory Visit: Payer: Self-pay | Admitting: Family Medicine

## 2018-03-19 DIAGNOSIS — I10 Essential (primary) hypertension: Secondary | ICD-10-CM

## 2019-03-30 ENCOUNTER — Ambulatory Visit: Payer: Managed Care, Other (non HMO) | Attending: Internal Medicine

## 2019-03-30 DIAGNOSIS — Z20822 Contact with and (suspected) exposure to covid-19: Secondary | ICD-10-CM

## 2019-04-01 LAB — NOVEL CORONAVIRUS, NAA: SARS-CoV-2, NAA: NOT DETECTED

## 2019-05-20 ENCOUNTER — Other Ambulatory Visit: Payer: Self-pay | Admitting: Family Medicine

## 2019-05-20 DIAGNOSIS — I10 Essential (primary) hypertension: Secondary | ICD-10-CM

## 2019-05-20 NOTE — Telephone Encounter (Signed)
Requested medication (s) are due for refill today: yes  Requested medication (s) are on the active medication list: yes  Last refill: 02/04/2019  Future visit scheduled:no  Notes to clinic:  no valid encounter within last 6 months    Requested Prescriptions  Pending Prescriptions Disp Refills   carvedilol (COREG) 6.25 MG tablet [Pharmacy Med Name: CARVEDILOL 6.25 MG TAB] 60 tablet 3    Sig: TAKE 1 TABLET BY MOUTH TWICE A DAY      Cardiovascular:  Beta Blockers Failed - 05/20/2019 11:30 AM      Failed - Valid encounter within last 6 months    Recent Outpatient Visits           1 year ago Laceration of scalp, subsequent encounter   [facility] [address] Chrismon, Jodell Cipro, [place]   2 years ago Right maxillary sinusitis   [facility], [place] E, [place]   3 years ago SBE (subacute bacterial endocarditis) prophylaxis candidate   [facility], [place] E, [place]   4 years ago Laceration of right buttock, subsequent encounter   [facility], [place] E, [place]   4 years ago Laceration   [facility] Joycelyn Man M, [place]              Passed - Last BP in normal range    BP Readings from Last 1 Encounters:  09/15/17 130/82          Passed - Last Heart Rate in normal range    Pulse Readings from Last 1 Encounters:  09/15/17 100

## 2019-07-01 ENCOUNTER — Ambulatory Visit: Payer: Managed Care, Other (non HMO) | Attending: Internal Medicine

## 2019-07-01 DIAGNOSIS — Z23 Encounter for immunization: Secondary | ICD-10-CM

## 2019-07-01 NOTE — Progress Notes (Signed)
   Covid-19 Vaccination Clinic  Name:  Joseph Butler    MRN: [id number] DOB: 14-Nov-1955  07/01/2019  Joseph Butler was observed post Covid-19 immunization for 15 minutes without incident. He was provided with Vaccine Information Sheet and instruction to access the V-Safe system.   Joseph Butler was instructed to call 911 with any severe reactions post vaccine: Marland Kitchen Difficulty breathing  . Swelling of face and throat  . A fast heartbeat  . A bad rash all over body  . Dizziness and weakness   Immunizations Administered    Name Date Dose VIS Date Route   Pfizer COVID-19 Vaccine 07/01/2019  1:49 PM 0.3 mL 03/18/2019 Intramuscular   Manufacturer: [employer], [employer]   Lot: [id number]   NDC: [address]-0

## 2019-07-27 ENCOUNTER — Ambulatory Visit: Payer: Managed Care, Other (non HMO) | Attending: Internal Medicine

## 2019-07-27 DIAGNOSIS — Z23 Encounter for immunization: Secondary | ICD-10-CM

## 2019-07-27 NOTE — Progress Notes (Signed)
   Covid-19 Vaccination Clinic  Name:  Joseph Butler    MRN: [id number] DOB: 1955/09/07  07/27/2019  [name] was observed post Covid-19 immunization for 15 minutes without incident. He was provided with Vaccine Information Sheet and instruction to access the V-Safe system.   [name] was instructed to call 911 with any severe reactions post vaccine: Marland Kitchen Difficulty breathing  . Swelling of face and throat  . A fast heartbeat  . A bad rash all over body  . Dizziness and weakness   Immunizations Administered    Name Date Dose VIS Date Route   Pfizer COVID-19 Vaccine 07/27/2019  4:33 PM 0.3 mL 06/01/2018 Intramuscular   Manufacturer: [employer], [employer]   Lot: [id number]   NDC: [address]-1

## 2020-04-27 ENCOUNTER — Ambulatory Visit: Payer: Managed Care, Other (non HMO)

## 2020-04-30 ENCOUNTER — Other Ambulatory Visit: Payer: Managed Care, Other (non HMO)

## 2020-04-30 DIAGNOSIS — Z20822 Contact with and (suspected) exposure to covid-19: Secondary | ICD-10-CM

## 2020-05-01 LAB — NOVEL CORONAVIRUS, NAA: SARS-CoV-2, NAA: DETECTED — AB

## 2020-05-01 LAB — SARS-COV-2, NAA 2 DAY TAT

## 2020-05-25 ENCOUNTER — Other Ambulatory Visit (HOSPITAL_COMMUNITY): Payer: Self-pay | Admitting: Internal Medicine

## 2020-05-25 ENCOUNTER — Ambulatory Visit: Payer: Managed Care, Other (non HMO) | Attending: Internal Medicine

## 2020-05-25 DIAGNOSIS — Z23 Encounter for immunization: Secondary | ICD-10-CM

## 2020-05-25 NOTE — Progress Notes (Signed)
   Covid-19 Vaccination Clinic  Name:  Joseph Butler    MRN: [id number] DOB: 12/20/1955  05/25/2020  [name] was observed post Covid-19 immunization for 15 minutes without incident. He was provided with Vaccine Information Sheet and instruction to access the V-Safe system.   [name] was instructed to call 911 with any severe reactions post vaccine: Marland Kitchen Difficulty breathing  . Swelling of face and throat  . A fast heartbeat  . A bad rash all over body  . Dizziness and weakness   Immunizations Administered    Name Date Dose VIS Date Route   PFIZER Comrnaty(Gray TOP) Covid-19 Vaccine 05/25/2020 12:46 PM 0.3 mL 03/15/2020 Intramuscular   Manufacturer: [employer], [employer]   Lot: [id number]   NDC: 2048503786

## 2020-09-11 ENCOUNTER — Other Ambulatory Visit: Payer: Self-pay

## 2020-09-11 ENCOUNTER — Emergency Department: Payer: Managed Care, Other (non HMO)

## 2020-09-11 ENCOUNTER — Emergency Department
Admission: EM | Admit: 2020-09-11 | Discharge: 2020-09-11 | Disposition: A | Discharge status: Home / Self Care | Payer: Managed Care, Other (non HMO) | Attending: Emergency Medicine | Admitting: Emergency Medicine

## 2020-09-11 ENCOUNTER — Encounter: Payer: Self-pay | Admitting: Emergency Medicine

## 2020-09-11 DIAGNOSIS — W19XXXA Unspecified fall, initial encounter: Secondary | ICD-10-CM | POA: Diagnosis not present

## 2020-09-11 DIAGNOSIS — R55 Syncope and collapse: Secondary | ICD-10-CM

## 2020-09-11 DIAGNOSIS — M545 Low back pain, unspecified: Secondary | ICD-10-CM | POA: Diagnosis not present

## 2020-09-11 DIAGNOSIS — R42 Dizziness and giddiness: Secondary | ICD-10-CM | POA: Insufficient documentation

## 2020-09-11 DIAGNOSIS — S0990XA Unspecified injury of head, initial encounter: Secondary | ICD-10-CM | POA: Insufficient documentation

## 2020-09-11 DIAGNOSIS — E860 Dehydration: Secondary | ICD-10-CM | POA: Diagnosis not present

## 2020-09-11 DIAGNOSIS — Z87891 Personal history of nicotine dependence: Secondary | ICD-10-CM | POA: Diagnosis not present

## 2020-09-11 DIAGNOSIS — R000 Tachycardia, unspecified: Secondary | ICD-10-CM | POA: Diagnosis not present

## 2020-09-11 DIAGNOSIS — R739 Hyperglycemia, unspecified: Secondary | ICD-10-CM

## 2020-09-11 LAB — CBC
HCT: 43.8 % (ref 39.0–52.0)
Hemoglobin: 15.5 g/dL (ref 13.0–17.0)
MCH: 29.3 pg (ref 26.0–34.0)
MCHC: 35.4 g/dL (ref 30.0–36.0)
MCV: 82.8 fL (ref 80.0–100.0)
Platelets: 178 10*3/uL (ref 150–400)
RBC: 5.29 MIL/uL (ref 4.22–5.81)
RDW: 12.1 % (ref 11.5–15.5)
WBC: 5.4 10*3/uL (ref 4.0–10.5)
nRBC: 0 % (ref 0.0–0.2)

## 2020-09-11 LAB — BASIC METABOLIC PANEL
Anion gap: 14 (ref 5–15)
BUN: 10 mg/dL (ref 8–23)
CO2: 24 mmol/L (ref 22–32)
Calcium: 9.1 mg/dL (ref 8.9–10.3)
Chloride: 97 mmol/L — ABNORMAL LOW (ref 98–111)
Creatinine, Ser: 0.82 mg/dL (ref 0.61–1.24)
GFR, Estimated: >60 mL/min (ref >60)
Glucose, Bld: 253 mg/dL — ABNORMAL HIGH (ref 70–99)
Potassium: 4.1 mmol/L (ref 3.5–5.1)
Sodium: 135 mmol/L (ref 135–145)

## 2020-09-11 LAB — HEMOGLOBIN A1C
Hgb A1c MFr Bld: 11.2 % — ABNORMAL HIGH (ref 4.8–5.6)
Mean Plasma Glucose: 275 mg/dL

## 2020-09-11 LAB — TROPONIN I (HIGH SENSITIVITY): Troponin I (High Sensitivity): 6 ng/L (ref <18)

## 2020-09-11 MED ORDER — LACTATED RINGERS IV BOLUS
1000.0000 mL | Freq: Once | INTRAVENOUS | Status: AC
  Administered 2020-09-11: 1000 mL via INTRAVENOUS

## 2020-09-11 NOTE — ED Triage Notes (Signed)
States Thursday evening while vacuuming the car had a syncopal event and woke up on the driveway.  States hit head..  Arrives today with c/o dizziness since Thursday and double vision intermittently since yesterday.  AAOx3.  Skin warm and dry.  MAE equally and strong.  Speech clear  Facial movements equal.  States double vision started again this morning at 0630.  Also c/o headache.

## 2020-09-11 NOTE — ED Provider Notes (Signed)
[facility] Emergency Department Provider Note  ____________________________________________   Event Date/Time   First MD Initiated Contact with Patient 09/11/20 [phone]     (approximate)  I have reviewed the triage vital signs and the nursing notes.   HISTORY  Chief Complaint Head Injury   HPI Joseph Butler is a 65 y.o. 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  Patient states he was outside working in a car when he thinks he got overheated and little dehydrated and passed out.  States he woke up on his back with a headache and some back pain.  States that he has not had any subsequent chest pain, shortness of breath, abdominal pain, vomiting, diarrhea, dysuria, rash or extremity pain but has had mild headache and intermittent episodes of vertigo primarily precipitated by bending over or moving quickly associated with double vision for distance.  He states elevations goes away when he covers 1 eye.  He does use glasses.  He denies any eye pain or blurry vision.  No prior similar episodes.  No other acute concerns at this time.         Past Medical History:  Diagnosis Date  . Arthritis    Knees, Hands  . Back pain   . Stenosis of cervical spine   . Stroke [facility] [place]) 2009   , Numbness Left    Patient Active Problem List   Diagnosis Date Noted  . Cerebral infarction (HCC) 04/04/2015  . Cervical spinal stenosis 04/04/2015  . Dyslipidemia 04/04/2015  . BP (high blood pressure) 04/04/2015  . Cervical spinal cord compression (HCC) 04/04/2015  . Spinal stenosis of thoracic region 04/04/2015  . Bilateral inguinal hernia 11/10/2012  . Chest pain 09/10/2012    Past Surgical History:   Procedure Laterality Date  . ANTERIOR CERVICAL DECOMP/DISCECTOMY FUSION  05/08/2011   Procedure: ANTERIOR CERVICAL DECOMPRESSION/DISCECTOMY FUSION 1 LEVEL;  Surgeon: Dorian Heckle, MD;  Location: [facility];  Service: Neurosurgery;  Laterality: N/A;  Cervical six-seven anterior cervical decompression with fusion interbody prothesis plating and bonegraft  . bone graft     x4  . HERNIA REPAIR Bilateral 2014   bilateral inguinal, TAPP with Physiomesh.  . neck fusion     x2    Prior to Admission medications   Medication Sig Start Date End Date Taking? Authorizing Provider  amitriptyline (ELAVIL) 50 MG tablet Take 50 mg by mouth 4 (four) times daily.     [provider]  baclofen (LIORESAL) 10 MG tablet Take 10 mg by mouth 4 (four) times daily.  09/01/12   [provider]  carvedilol (COREG) 6.25 MG tablet TAKE 1 TABLET BY MOUTH TWICE A DAY 05/20/19   Chrismon, Jodell Cipro, PA-C  COVID-19 mRNA Vac-TriS, Pfizer, SUSP injection USE AS DIRECTED 05/25/20 05/25/21  Judyann Munson, MD  glucose blood test strip Reported on 04/05/2015 01/03/13   [provider]  niacin 500 MG CR capsule Take by mouth.    [provider]  Omega-3 Fatty Acids (FISH OIL BURP-LESS) 1200 MG CAPS Take by mouth.    [provider]  Oxycodone HCl 10 MG TABS as needed.  08/27/12   [provider]    Allergies Patient has no known allergies.  Family History  Problem Relation Age of Onset  . Hypertension Mother   . Atrial fibrillation Mother   . Hypertension Father     Social History Social History   Tobacco Use  . Smoking status: Former Smoker    Packs/day: 1.00    Years: 30.00    Pack years: 30.00    Types: Cigarettes    Quit date: 04/08/2007    Years since quitting: 13.4  . Smokeless tobacco: Never Used  Substance Use Topics  . Alcohol use: Yes    Comment: occasionally   . Drug use: No    Review of Systems  Review of Systems  Constitutional: Negative  for chills and fever.  HENT: Negative for sore throat.   Eyes: Positive for double vision. Negative for pain.  Respiratory: Negative for cough and stridor.   Cardiovascular: Negative for chest pain.  Gastrointestinal: Negative for vomiting.  Genitourinary: Negative for dysuria.  Musculoskeletal: Positive for back pain.  Skin: Negative for rash.  Neurological: Positive for dizziness and headaches. Negative for seizures and loss of consciousness.  Psychiatric/Behavioral: Negative for suicidal ideas.  All other systems reviewed and are negative.     ____________________________________________   PHYSICAL EXAM:  VITAL SIGNS: ED Triage Vitals  Enc Vitals Group     BP 09/11/20 0707 126/75     Pulse Rate 09/11/20 0707 (!) 103     Resp 09/11/20 0707 16     Temp 09/11/20 0707 97.9 F (36.6 C)     Temp Source 09/11/20 0707 Oral     SpO2 09/11/20 0707 100 %     Weight 09/11/20 0707 185 lb 13.6 oz (84.3 kg)     Height 09/11/20 0707 5\' 11"  (1.803 m)     Head Circumference --      Peak Flow --      Pain Score 09/11/20 0706 8     Pain Loc --      Pain Edu? --      Excl. in GC? --    Vitals:   09/11/20 0707  BP: 126/75  Pulse: (!) 103  Resp: 16  Temp: 97.9 F (36.6 C)  SpO2: 100%   Physical Exam Vitals and nursing note reviewed.  Constitutional:      Appearance: He is well-developed.  HENT:     Head: Normocephalic and atraumatic.     Right Ear: External ear normal.     Left Ear: External ear normal.     Nose: Nose normal.     Mouth/Throat:     Mouth: Mucous membranes are dry.  Eyes:     Conjunctiva/sclera: Conjunctivae normal.  Cardiovascular:     Rate and Rhythm: Regular rhythm. Tachycardia present.     Heart sounds: No murmur heard.   Pulmonary:     Effort: Pulmonary effort is normal. No respiratory distress.     Breath sounds: Normal breath sounds.  Abdominal:     Palpations: Abdomen is soft.     Tenderness: There is no abdominal tenderness.   Musculoskeletal:     Cervical back: Neck supple.  Skin:    General: Skin is warm and dry.     Capillary Refill: Capillary refill takes less than 2 seconds.  Neurological:     Mental Status: He is alert and oriented to person, place, and time.  Psychiatric:        Mood and Affect: Mood normal.     Cranial nerves II through XII grossly intact.  No pronator drift.  No  finger dysmetria.  Symmetric 5/5 strength of all extremities.  Sensation intact to light touch in all extremities.  Unremarkable unassisted gait.  Very mild tenderness over the C/T/L-spine.  No significant overlying skin changes.  No obvious trauma to the extremities.  2+ radial pulses.  Patient is able to ambulate without difficulty.  Visual acuity is 20/35 bilaterally. ____________________________________________   LABS (all labs ordered are listed, but only abnormal results are displayed)  Labs Reviewed  BASIC METABOLIC PANEL - Abnormal; Notable for the following components:      Result Value   Chloride 97 (*)    Glucose, Bld 253 (*)    All other components within normal limits  CBC  URINALYSIS, COMPLETE (UACMP) WITH MICROSCOPIC  HEMOGLOBIN A1C  CBG MONITORING, ED  TROPONIN I (HIGH SENSITIVITY)  TROPONIN I (HIGH SENSITIVITY)   ____________________________________________  EKG  Sinus rhythm with a ventricular rate of 105, normal axis, unremarkable intervals with some nonspecific ST change versus artifact in V3. ____________________________________________  RADIOLOGY  ED MD interpretation: CT head and C-spine showed no acute intracranial abnormality skull fracture or acute C-spine injury.  Chest x-ray has a small granuloma at the right base with otherwise no evidence of heart failure, effusion, edema, pneumothorax or any other clear acute intrathoracic process.  Plain film of the T-spine shows postop changes without fracture or dislocation.  Plain film of L-spine shows no fracture.  There is some anterolisthesis  of L5 on S1 and disc space narrowing at L5 and S1 but otherwise no other acute injuries or changes noted.  Official radiology report(s): DG Chest 2 View  Result Date: 09/11/2020 CLINICAL DATA:  Syncope EXAM: CHEST - 2 VIEW COMPARISON:  Aug 20, 2012 FINDINGS: There is a small calcified granuloma in the right apex, stable. No edema or airspace opacity. Heart size and pulmonary vascularity are normal. No adenopathy. There is aortic atherosclerosis. There is degenerative change in each shoulder. There is postoperative change in the visualized cervical spine. No pneumothorax. IMPRESSION: Small granuloma right base. Lungs otherwise clear. Heart size normal. Aortic Atherosclerosis (ICD10-I70.0). Electronically Signed   By: Bretta Bang III M.D.   On: 09/11/2020 09:10   DG Thoracic Spine 2 View  Result Date: 09/11/2020 CLINICAL DATA:  Pain following fall EXAM: THORACIC SPINE 3 VIEWS COMPARISON:  None. FINDINGS: Frontal, lateral, and swimmer's views were obtained. There is anterior screw and plate fixation from C3-C7 with disc spacers at C3-4, C4-5, C5-6, and C6-7. There is no fracture or spondylolisthesis. There is slight disc space narrowing at several levels. No erosive change or paraspinous lesion. Visualized lungs clear. IMPRESSION: Multilevel postoperative change in the cervical spine. No fracture or spondylolisthesis. Mild disc space narrowing at several levels in the thoracic region. Electronically Signed   By: Bretta Bang III M.D.   On: 09/11/2020 09:08   DG Lumbar Spine 2-3 Views  Result Date: 09/11/2020 CLINICAL DATA:  Pain following fall EXAM: LUMBAR SPINE - 2-3 VIEW COMPARISON:  None. FINDINGS: Frontal, lateral, and spot lumbosacral lateral images were obtained. There are 5 non-rib-bearing lumbar type vertebral bodies. There is mild upper lumbar dextroscoliosis. There is no appreciable fracture. There is 1.8 cm of anterolisthesis of L5 on S1 with apparent pars defects at L5 bilaterally. No  other appreciable spondylolisthesis. There is severe disc space narrowing at L5-S1. Other disc spaces appear unremarkable. There is facet arthropathy at L5-S1 bilaterally. There are foci of aortic atherosclerosis. IMPRESSION: No acute fracture. Apparent pars defects at L5 bilaterally with 1.8 cm of  anterolisthesis of L5 on S1. No other spondylolisthesis. Severe disc space narrowing and facet osteoarthritic change at L5-S1. Other disc spaces appear unremarkable. Aortic Atherosclerosis (ICD10-I70.0). Electronically Signed   By: Bretta BangWilliam  Woodruff III M.D.   On: 09/11/2020 09:07   CT HEAD WO CONTRAST  Result Date: 09/11/2020 CLINICAL DATA:  Head and neck trauma Syncopal episode wall vacuuming car Woke up on driveway EXAM: CT HEAD WITHOUT CONTRAST CT CERVICAL SPINE WITHOUT CONTRAST TECHNIQUE: Multidetector CT imaging of the head and cervical spine was performed following the standard protocol without intravenous contrast. Multiplanar CT image reconstructions of the cervical spine were also generated. COMPARISON:  CT cervical spine 08/28/2022 CT head 04/22/2011 FINDINGS: CT HEAD FINDINGS Brain: No evidence of acute infarction, hemorrhage, hydrocephalus, extra-axial collection or mass lesion/mass effect. Vascular: No hyperdense vessel or unexpected calcification. Skull: Normal. Negative for fracture or focal lesion. Sinuses/Orbits: No acute finding. Other: None. CT CERVICAL SPINE FINDINGS Alignment: Within normal limits. Skull base and vertebrae: No acute fracture. No primary bone lesion or focal pathologic process. Soft tissues and spinal canal: No prevertebral fluid or swelling. No visible canal hematoma. Disc levels: C2-C7 ACDF changes are seen. The hardware is intact without periprosthetic fracture or lucency. There is near complete interbody fusion at the postsurgical levels. Degenerative changes are seen throughout the cervical spine predominantly involving the left neural foramen. Upper chest: Negative. Other: 10  x 6 mm lucent lesion again seen in the clivus with patent sinus track to the nasopharynx is consistent with benign notochordal alignment. It is not significantly changed compared to the MRI from 09/23/2007. IMPRESSION: 1. No acute intracranial abnormality. 2. No acute abnormality of the cervical or visualized upper thoracic spine. Electronically Signed   By: Acquanetta BellingFarhaan  Mir M.D.   On: 09/11/2020 08:51   CT Cervical Spine Wo Contrast  Result Date: 09/11/2020 CLINICAL DATA:  Head and neck trauma Syncopal episode wall vacuuming car Woke up on driveway EXAM: CT HEAD WITHOUT CONTRAST CT CERVICAL SPINE WITHOUT CONTRAST TECHNIQUE: Multidetector CT imaging of the head and cervical spine was performed following the standard protocol without intravenous contrast. Multiplanar CT image reconstructions of the cervical spine were also generated. COMPARISON:  CT cervical spine 08/28/2022 CT head 04/22/2011 FINDINGS: CT HEAD FINDINGS Brain: No evidence of acute infarction, hemorrhage, hydrocephalus, extra-axial collection or mass lesion/mass effect. Vascular: No hyperdense vessel or unexpected calcification. Skull: Normal. Negative for fracture or focal lesion. Sinuses/Orbits: No acute finding. Other: None. CT CERVICAL SPINE FINDINGS Alignment: Within normal limits. Skull base and vertebrae: No acute fracture. No primary bone lesion or focal pathologic process. Soft tissues and spinal canal: No prevertebral fluid or swelling. No visible canal hematoma. Disc levels: C2-C7 ACDF changes are seen. The hardware is intact without periprosthetic fracture or lucency. There is near complete interbody fusion at the postsurgical levels. Degenerative changes are seen throughout the cervical spine predominantly involving the left neural foramen. Upper chest: Negative. Other: 10 x 6 mm lucent lesion again seen in the clivus with patent sinus track to the nasopharynx is consistent with benign notochordal alignment. It is not significantly changed  compared to the MRI from 09/23/2007. IMPRESSION: 1. No acute intracranial abnormality. 2. No acute abnormality of the cervical or visualized upper thoracic spine. Electronically Signed   By: Acquanetta BellingFarhaan  Mir M.D.   On: 09/11/2020 08:51    ____________________________________________   PROCEDURES  Procedure(s) performed (including Critical Care):  .1-3 Lead EKG Interpretation Performed by: Gilles ChiquitoSmith, Opal Dinning P, MD Authorized by: Gilles ChiquitoSmith, Zaeem Kandel P, MD  Interpretation: normal     ECG rate assessment: normal     Rhythm: sinus rhythm     Ectopy: none     Conduction: normal       ____________________________________________   INITIAL IMPRESSION / ASSESSMENT AND PLAN / ED COURSE      Patient presents with above to history exam for dysarthric episodes of vertigo last couple minutes precipitated by motion associated with some binocular double vision that only occurs any recent vertigo since he had a syncopal episode or fall couple days ago.  He states that he is not currently feeling any vertigo or vision.  On arrival he is tachycardic with otherwise nonfocal neuro exam and stable vitals.  No obvious injuries on exam with some mild tenderness throughout the spine.  This in setting of previous spine injuries.  Regard to etiology for syncope he denies any preceding symptoms and thinks he may gotten a little dehydrated.  He does appear little dehydrated with dry mucous membranes and mild tachycardia on exam.  Vital certainly possible he got dehydrated from being outside in the heat suspect he may have also got dehydrated from possibly undiagnosed diabetes as he is noted to hyperglycemic with a glucose of 253 emergency room today.  He denies any history of diabetes.  No other significant electrode or metabolic derangements noted on BMP.  No evidence of DKA.  He was given IV fluids.  Advised that we will send A1c but he was follows up with his PCP.  CBC shows no leukocytosis or acute anemia.  ECG and  nonelevated troponin are not suggestive of ACS or myocarditis.  No significant arrhythmia identified.  Low suspicion for cardiac syncope at this time.  Is certainly possible he sustained a mild concussion from falling on his head although given intermittent nature of his vertigo I suspect he may have developed some BPPV.  However he has no symptoms at this time and given otherwise nonfocal neuro exam with stable vitals and patient tolerating p.o. had improvement in his heart rate after IV fluid hydration I think he is safe for discharge with close outpatient follow-up.  Low suspicion for CVA or other Mi life-threatening process at this time.  Advised him of pending A1c test and that he should adequately hydrate.  Discharged in stable condition.  Strict return precautions advised and discussed.         ____________________________________________   FINAL CLINICAL IMPRESSION(S) / ED DIAGNOSES  Final diagnoses:  Syncope, unspecified syncope type  Vertigo  Hyperglycemia  Dehydration    Medications  lactated ringers bolus 1,000 mL (1,000 mLs Intravenous New Bag/Given 09/11/20 0915)     ED Discharge Orders    None       Note:  This document was prepared using Dragon voice recognition software and may include unintentional dictation errors.   Gilles Chiquito, MD 09/11/20 1007

## 2020-09-11 NOTE — ED Notes (Signed)
See triage note, pt reports getting dizzy and falling at work on Thursday, c/o headache, double vision and dizziness since.  NAD noted, clear speech

## 2020-09-25 ENCOUNTER — Other Ambulatory Visit: Payer: Self-pay

## 2020-09-25 ENCOUNTER — Ambulatory Visit (INDEPENDENT_AMBULATORY_CARE_PROVIDER_SITE_OTHER): Payer: Managed Care, Other (non HMO) | Admitting: Family Medicine

## 2020-09-25 VITALS — BP 128/81 | HR 100 | Wt 178.0 lb

## 2020-09-25 DIAGNOSIS — E119 Type 2 diabetes mellitus without complications: Secondary | ICD-10-CM | POA: Diagnosis not present

## 2020-09-25 DIAGNOSIS — R55 Syncope and collapse: Secondary | ICD-10-CM

## 2020-09-25 NOTE — Progress Notes (Signed)
Established patient visit   Patient: Joseph Butler   DOB: [birth year]   65 y.o. Male  MRN: [id number] Visit Date: 09/25/2020  Today's healthcare provider: Dortha Kern, PA-C   No chief complaint on file.  Subjective    HPI  Patient is a 65 year old male who presents for follow up after ER visit for on 09/11/20.  Patient had been working outside in his car and thinks he got overheated and passed out on 09/06/20.  He then had a fall at work on 09/11/20 after bending over to pick something up.  He did not have any LOC at that time but did have double vision causing him to decide on going to the ER.  When he woke he was on his back and had headache and backache.  Exact cause for syncopal episode was not determined but, thought to be from dehydration along with blood sugar of 250+ and A1C of 11.2. Patient has history of neck and back issues, resulting is surgeries. Patient reports that he feels he is now back to his normal state. Patient has a history of elevated glucose pre Covid that had resolved with changes in habits.  Once Covid hit patient was at home more and eating habits and activities have changed resulting in the elevated sugars again.  Past Medical History:  Diagnosis Date   Arthritis    Knees, Hands   Back pain    Stenosis of cervical spine    Stroke (HCC) 2009   , Numbness Left   Past Surgical History:  Procedure Laterality Date   ANTERIOR CERVICAL DECOMP/DISCECTOMY FUSION  05/08/2011   Procedure: ANTERIOR CERVICAL DECOMPRESSION/DISCECTOMY FUSION 1 LEVEL;  Surgeon: Dorian Heckle, MD;  Location: [facility];  Service: Neurosurgery;  Laterality: N/A;  Cervical six-seven anterior cervical decompression with fusion interbody prothesis plating and bonegraft   bone graft     x4   HERNIA REPAIR Bilateral 2014   bilateral inguinal, TAPP with Physiomesh.   neck fusion     x2   Family History  Problem Relation Age of Onset   Hypertension Mother    Atrial fibrillation Mother     Hypertension Father    Social History   Tobacco Use   Smoking status: Former    Packs/day: 1.00    Years: 30.00    Pack years: 30.00    Types: Cigarettes    Quit date: 04/08/2007    Years since quitting: 13.7   Smokeless tobacco: Never  Substance Use Topics   Alcohol use: Yes    Comment: occasionally    Drug use: No   No Known Allergies  Medications: Outpatient Medications Prior to Visit  Medication Sig   amitriptyline (ELAVIL) 50 MG tablet Take 50 mg by mouth 4 (four) times daily.    baclofen (LIORESAL) 10 MG tablet Take 10 mg by mouth 4 (four) times daily.    niacin 500 MG CR capsule Take by mouth.   Omega-3 Fatty Acids (FISH OIL BURP-LESS) 1200 MG CAPS Take by mouth.   Oxycodone HCl 10 MG TABS as needed.    carvedilol (COREG) 6.25 MG tablet TAKE 1 TABLET BY MOUTH TWICE A DAY (Patient not taking: Reported on 09/25/2020)   COVID-19 mRNA Vac-TriS, Pfizer, SUSP injection USE AS DIRECTED   glucose blood test strip Reported on 04/05/2015   No facility-administered medications prior to visit.    Review of Systems  Eyes:  Negative for visual disturbance.  Respiratory:  Negative  for shortness of breath.   Cardiovascular:  Negative for chest pain, palpitations and leg swelling.  Neurological:  Positive for syncope, weakness (left side) and numbness (hands). Negative for dizziness, light-headedness and headaches.       Objective    BP 128/81 (BP Location: Right Arm, Patient Position: Sitting, Cuff Size: Normal)   Pulse 100   Wt 178 lb (80.7 kg)   BMI 24.83 kg/m   Physical Exam    No results found for any visits on 09/25/20.  Assessment & Plan     1. Syncope and collapse Diagnosed with dehydration in the ER on 09-11-20. Suspect secondary to heat exhaustion. Advised to restrict time outdoors this summer with his history of CVA in 2016. Recheck labs to assess progress. Feeling better today without further syncopal episodes. - CBC with Differential/Platelet -  Comprehensive metabolic panel  2. Diabetes mellitus without complication (HCC) On 09-11-20, Hgb A1C was 11.2 with blood sugar 253 when he went to the ER for evaluation of syncope with vertigo and dehydration. Recheck labs and encouraged to drink plenty of fluids. - CBC with Differential/Platelet - Comprehensive metabolic panel   No follow-ups on file.      I, Jentzen Minasyan, PA-C, have reviewed all documentation for this visit. The documentation on 12/31/20 for the exam, diagnosis, procedures, and orders are all accurate and complete.    Dortha Kern, PA-C  [employer] [phone] (phone) [phone] (fax)  [facility]

## 2020-09-26 LAB — COMPREHENSIVE METABOLIC PANEL
ALT: 26 IU/L (ref 0–44)
AST: 17 IU/L (ref 0–40)
Albumin/Globulin Ratio: 2.1 (ref 1.2–2.2)
Albumin: 4.5 g/dL (ref 3.8–4.8)
Alkaline Phosphatase: 106 IU/L (ref 44–121)
BUN/Creatinine Ratio: 12 (ref 10–24)
BUN: 9 mg/dL (ref 8–27)
Bilirubin Total: 0.3 mg/dL (ref 0.0–1.2)
CO2: 26 mmol/L (ref 20–29)
Calcium: 9.3 mg/dL (ref 8.6–10.2)
Chloride: 98 mmol/L (ref 96–106)
Creatinine, Ser: 0.77 mg/dL (ref 0.76–1.27)
Globulin, Total: 2.1 g/dL (ref 1.5–4.5)
Glucose: 150 mg/dL — ABNORMAL HIGH (ref 65–99)
Potassium: 5.1 mmol/L (ref 3.5–5.2)
Sodium: 137 mmol/L (ref 134–144)
Total Protein: 6.6 g/dL (ref 6.0–8.5)
eGFR: 101 mL/min/{1.73_m2} (ref >59)

## 2020-09-26 LAB — CBC WITH DIFFERENTIAL/PLATELET
Basophils Absolute: 0 10*3/uL (ref 0.0–0.2)
Basos: 0 %
EOS (ABSOLUTE): 0.1 10*3/uL (ref 0.0–0.4)
Eos: 1 %
Hematocrit: 44.4 % (ref 37.5–51.0)
Hemoglobin: 15.1 g/dL (ref 13.0–17.7)
Immature Grans (Abs): 0 10*3/uL (ref 0.0–0.1)
Immature Granulocytes: 0 %
Lymphocytes Absolute: 1.3 10*3/uL (ref 0.7–3.1)
Lymphs: 23 %
MCH: 29 pg (ref 26.6–33.0)
MCHC: 34 g/dL (ref 31.5–35.7)
MCV: 85 fL (ref 79–97)
Monocytes Absolute: 0.4 10*3/uL (ref 0.1–0.9)
Monocytes: 8 %
Neutrophils Absolute: 3.9 10*3/uL (ref 1.4–7.0)
Neutrophils: 68 %
Platelets: 222 10*3/uL (ref 150–450)
RBC: 5.2 x10E6/uL (ref 4.14–5.80)
RDW: 12.7 % (ref 11.6–15.4)
WBC: 5.7 10*3/uL (ref 3.4–10.8)

## 2020-12-31 ENCOUNTER — Encounter: Payer: Self-pay | Admitting: Family Medicine

## 2021-02-08 ENCOUNTER — Ambulatory Visit: Payer: Managed Care, Other (non HMO) | Attending: Internal Medicine

## 2021-02-08 ENCOUNTER — Other Ambulatory Visit: Payer: Self-pay

## 2021-02-08 DIAGNOSIS — Z23 Encounter for immunization: Secondary | ICD-10-CM

## 2021-02-08 MED ORDER — PFIZER COVID-19 VAC BIVALENT 30 MCG/0.3ML IM SUSP
INTRAMUSCULAR | 0 refills | Status: AC
  Filled 2021-02-08: qty 0.3, 1d supply, fill #0

## 2021-02-08 NOTE — Progress Notes (Signed)
   Covid-19 Vaccination Clinic  Name:  ALEXI GEIBEL    MRN: [id number] DOB: 20-Jul-1955  02/08/2021  Mr. Layson was observed post Covid-19 immunization for 15 minutes without incident. He was provided with Vaccine Information Sheet and instruction to access the V-Safe system.   Mr. Haddix was instructed to call 911 with any severe reactions post vaccine: Difficulty breathing  Swelling of face and throat  A fast heartbeat  A bad rash all over body  Dizziness and weakness   Immunizations Administered     Name Date Dose VIS Date Route   Pfizer Covid-19 Vaccine Bivalent Booster 02/08/2021 11:41 AM 0.3 mL 12/05/2020 Intramuscular   Manufacturer: [employer], [employer]   Lot: [id number]   NDC: [address]-3      Drusilla Kanner, PharmD, MBA Clinical Acute Care Pharmacist

## 2021-06-22 IMAGING — CT CT HEAD W/O CM
3 series · 15 of 46 positions shown, 18 images · non-contrast
Comparison: CT cervical spine 08/28/2022

CT head 04/22/2011

CLINICAL DATA: Head and neck trauma

Syncopal episode wall vacuuming car
Woke up on driveway
EXAM:
CT HEAD WITHOUT CONTRAST
CT CERVICAL SPINE WITHOUT CONTRAST
TECHNIQUE: Multidetector CT imaging of the head and cervical spine was
performed following the standard protocol without intravenous
contrast. Multiplanar CT image reconstructions of the cervical spine
were also generated.

[Series 2: head wo · axial · 0.43mm/px · z∈[-125,-5]mm · 9 of 29 slices shown, 12 images]
[im 3/29  brain]
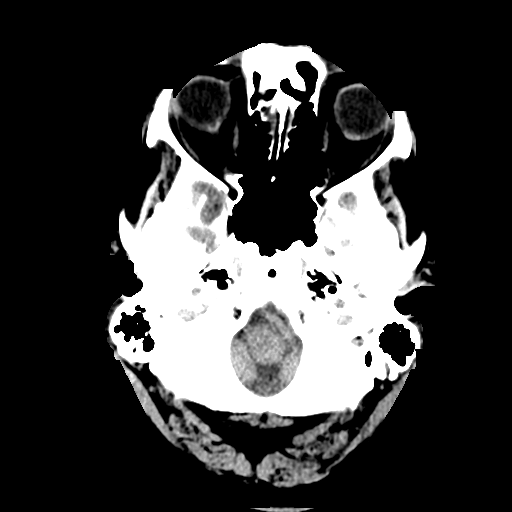
[im 3/29  bone]
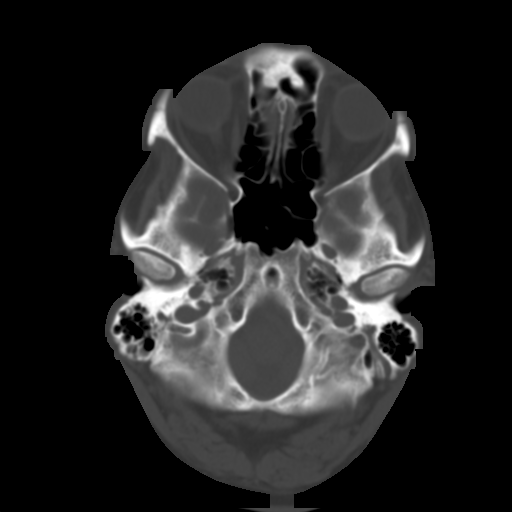
[im 6/29  brain]
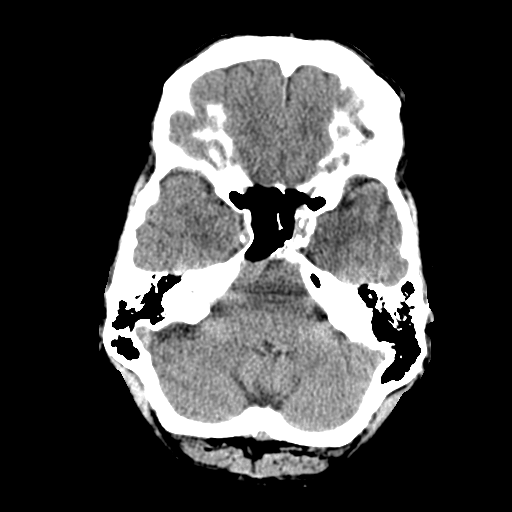
[im 9/29  brain]
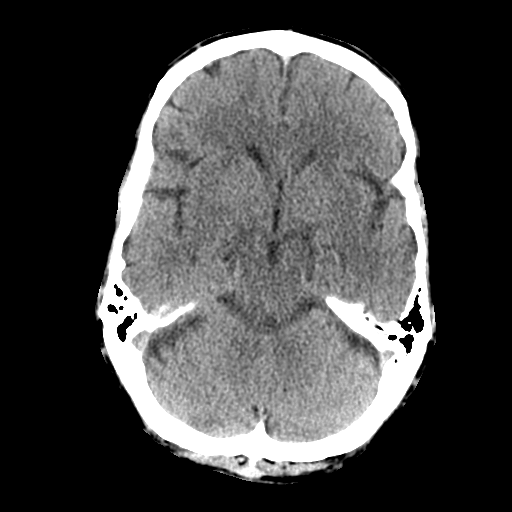
[im 12/29  brain]
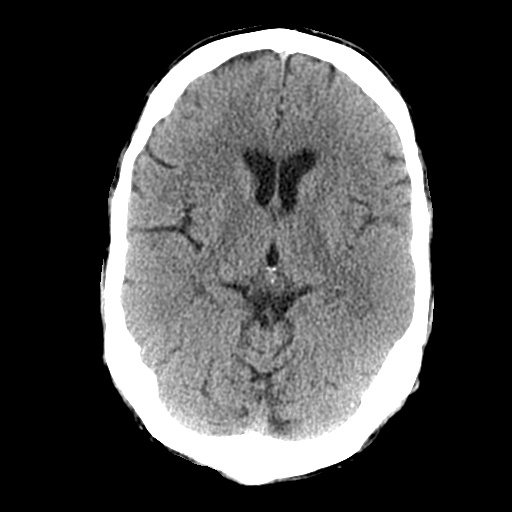
[im 15/29  brain]
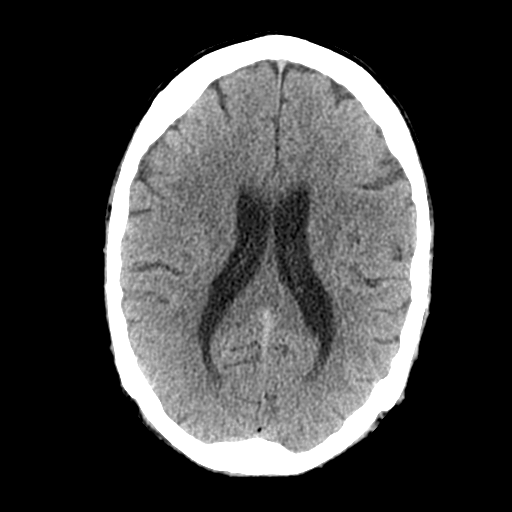
[im 15/29  bone]
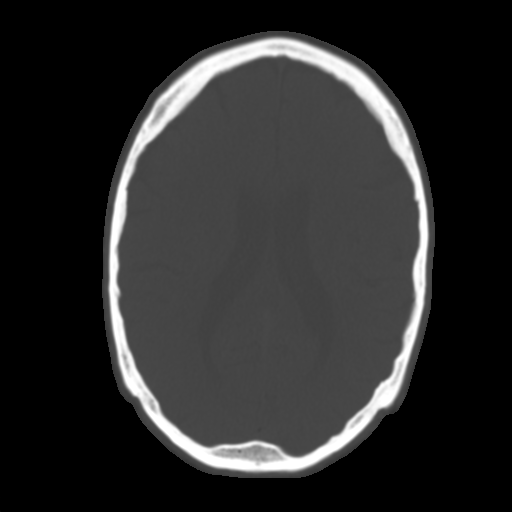
[im 18/29  brain]
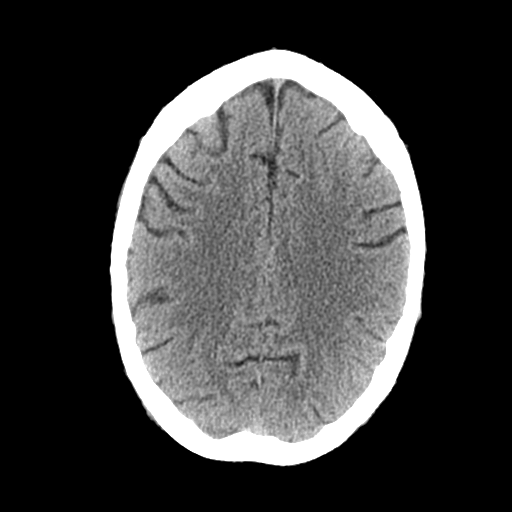
[im 21/29  brain]
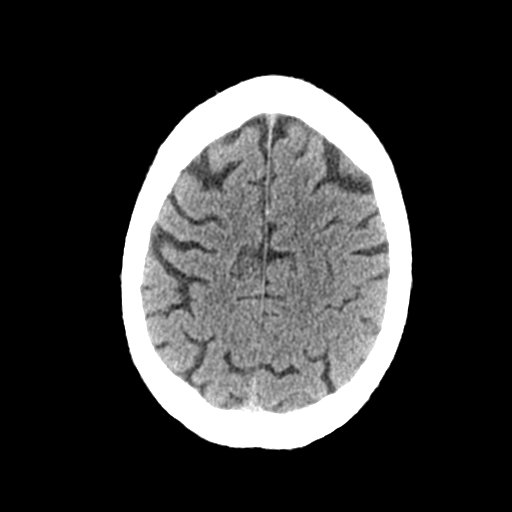
[im 24/29  brain]
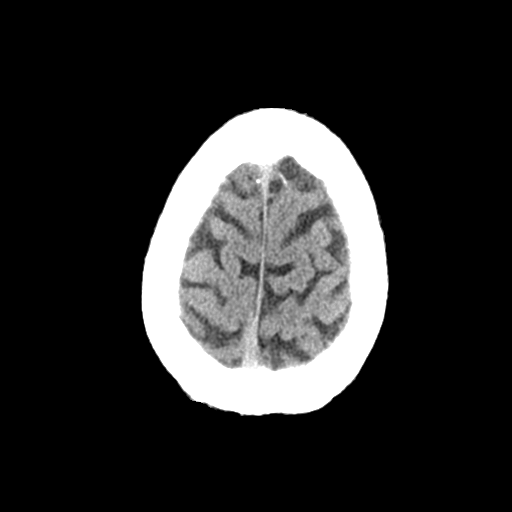
[im 27/29  brain]
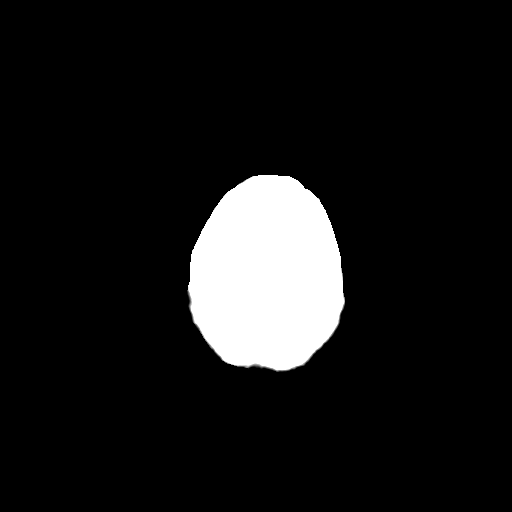
[im 27/29  bone]
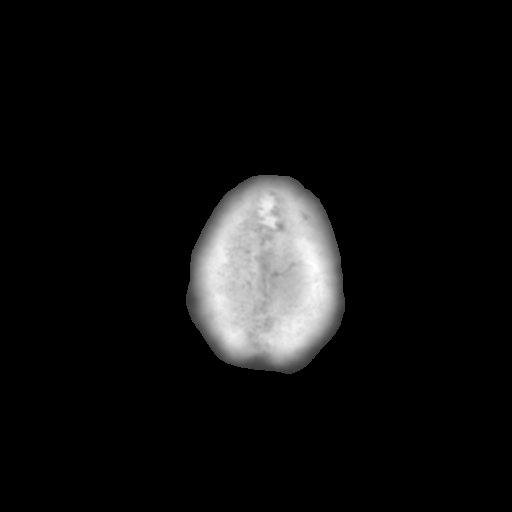

[Series 4: coronal soft tissue · coronal · 0.31mm/px · 3 of 69 slices shown]
[im 23/69  brain]
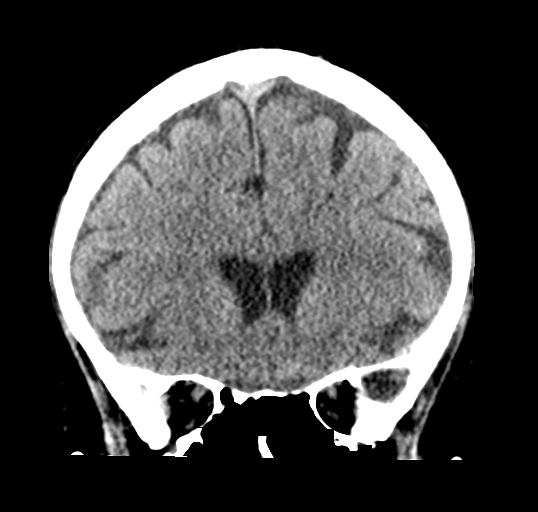
[im 31/69  brain]
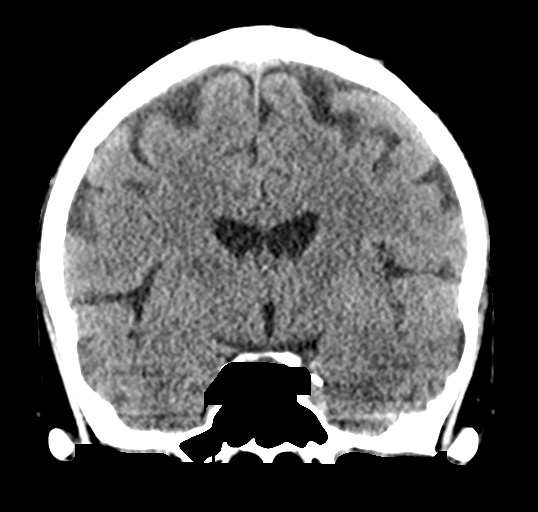
[im 38/69  brain]
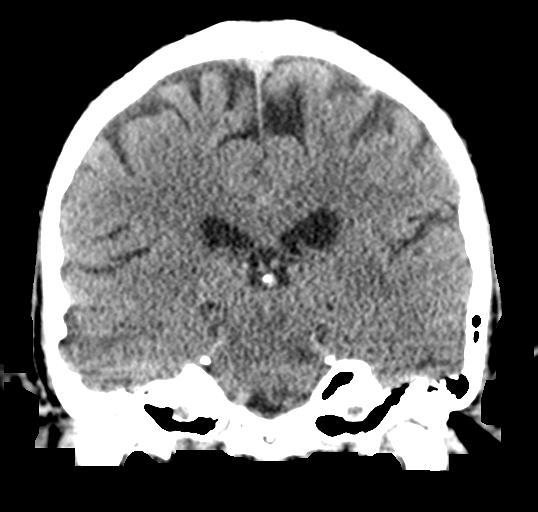

[Series 5: sagittal soft tissue · sagittal · 0.32mm/px · 3 of 55 slices shown]
[im 19/55  brain]
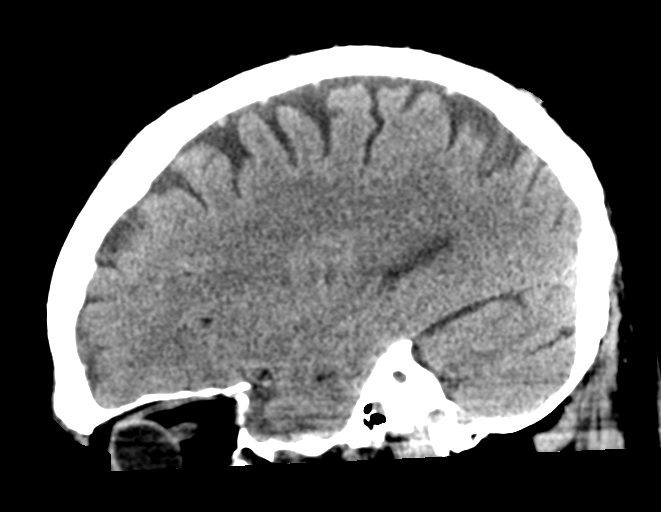
[im 28/55  brain]
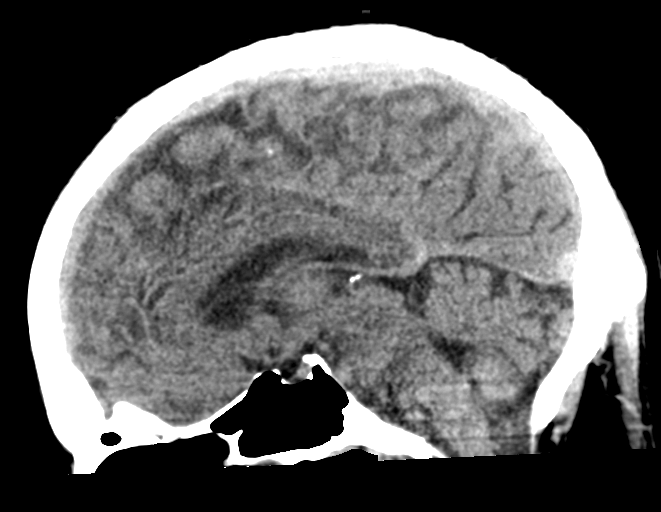
[im 37/55  brain]
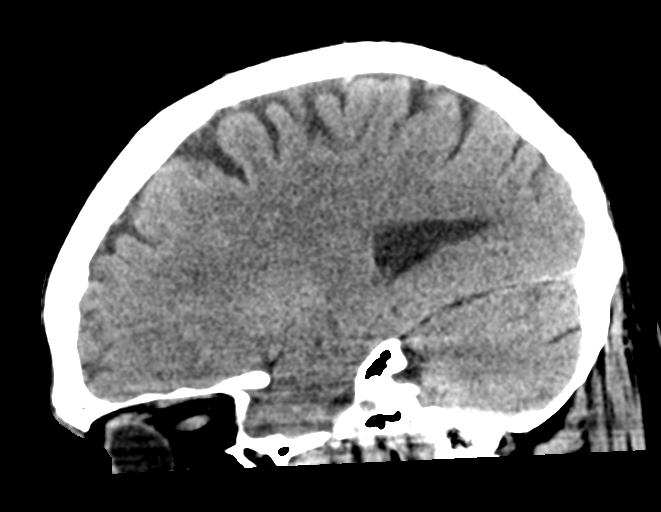

[15 of 46 positions shown; findings below may reference images not displayed]

FINDINGS: CT HEAD FINDINGS

Brain: No evidence of acute infarction, hemorrhage, hydrocephalus,
extra-axial collection or mass lesion/mass effect.

Vascular: No hyperdense vessel or unexpected calcification.

Skull: Normal. Negative for fracture or focal lesion.

Sinuses/Orbits: No acute finding.

Other: None.

CT CERVICAL SPINE FINDINGS

Alignment: Within normal limits.

Skull base and vertebrae: No acute fracture. No primary bone lesion
or focal pathologic process.

Soft tissues and spinal canal: No prevertebral fluid or swelling. No
visible canal hematoma.

Disc levels: C2-C7 ACDF changes are seen. The hardware is intact
without periprosthetic fracture or lucency. There is near complete
interbody fusion at the postsurgical levels. Degenerative changes
are seen throughout the cervical spine predominantly involving the
left neural foramen.

Upper chest: Negative.

Other: 10 x 6 mm lucent lesion again seen in the clivus with patent
sinus track to the nasopharynx is consistent with benign notochordal
alignment. It is not significantly changed compared to the MRI from
09/23/2007.
IMPRESSION: 1. No acute intracranial abnormality.
2. No acute abnormality of the cervical or visualized upper thoracic
spine.

## 2023-02-27 ENCOUNTER — Other Ambulatory Visit: Payer: Self-pay | Admitting: Neurosurgery

## 2023-02-27 DIAGNOSIS — M5416 Radiculopathy, lumbar region: Secondary | ICD-10-CM

## 2023-03-09 ENCOUNTER — Encounter: Payer: Self-pay | Admitting: Cardiology

## 2023-03-13 ENCOUNTER — Encounter: Payer: Self-pay | Admitting: Neurosurgery

## 2023-04-04 ENCOUNTER — Other Ambulatory Visit: Payer: No Typology Code available for payment source

## 2023-04-18 ENCOUNTER — Other Ambulatory Visit: Payer: No Typology Code available for payment source

## 2023-04-26 ENCOUNTER — Ambulatory Visit
Admission: RE | Admit: 2023-04-26 | Discharge: 2023-04-26 | Disposition: A | Discharge status: Home / Self Care | Payer: No Typology Code available for payment source | Source: Ambulatory Visit | Attending: Neurosurgery | Admitting: Neurosurgery

## 2023-04-26 DIAGNOSIS — M5416 Radiculopathy, lumbar region: Secondary | ICD-10-CM

## 2024-02-08 ENCOUNTER — Other Ambulatory Visit: Payer: Self-pay | Admitting: Medical Genetics

## 2024-02-12 ENCOUNTER — Other Ambulatory Visit
Admission: RE | Admit: 2024-02-12 | Discharge: 2024-02-12 | Disposition: A | Discharge status: Home / Self Care | Payer: Self-pay | Source: Ambulatory Visit | Attending: Medical Genetics | Admitting: Medical Genetics

## 2024-02-23 LAB — GENECONNECT MOLECULAR SCREEN: Genetic Analysis Overall Interpretation: NEGATIVE
